# Patient Record
Sex: Female | Born: 1980 | Race: Black or African American | Hispanic: No | Marital: Single | Smoking: Current every day smoker
Health system: Southern US, Community
[De-identification: ages and names within clinical notes are randomized; demographics above are authoritative.]

## PROBLEM LIST (undated history)

## (undated) DIAGNOSIS — N200 Calculus of kidney: Secondary | ICD-10-CM

## (undated) DIAGNOSIS — Z91199 Patient's noncompliance with other medical treatment and regimen due to unspecified reason: Secondary | ICD-10-CM

## (undated) DIAGNOSIS — F1210 Cannabis abuse, uncomplicated: Secondary | ICD-10-CM

## (undated) DIAGNOSIS — R569 Unspecified convulsions: Secondary | ICD-10-CM

## (undated) DIAGNOSIS — Z9119 Patient's noncompliance with other medical treatment and regimen: Secondary | ICD-10-CM

## (undated) DIAGNOSIS — R87629 Unspecified abnormal cytological findings in specimens from vagina: Secondary | ICD-10-CM

## (undated) DIAGNOSIS — T1490XA Injury, unspecified, initial encounter: Secondary | ICD-10-CM

## (undated) DIAGNOSIS — D649 Anemia, unspecified: Secondary | ICD-10-CM

## (undated) HISTORY — PX: THERAPEUTIC ABORTION: SHX798

---

## 1999-11-23 ENCOUNTER — Emergency Department (HOSPITAL_COMMUNITY): Admission: EM | Admit: 1999-11-23 | Discharge: 1999-11-23 | Payer: Self-pay | Admitting: Emergency Medicine

## 2001-03-10 ENCOUNTER — Emergency Department (HOSPITAL_COMMUNITY): Admission: EM | Admit: 2001-03-10 | Discharge: 2001-03-10 | Payer: Self-pay | Admitting: Emergency Medicine

## 2001-04-23 ENCOUNTER — Ambulatory Visit (HOSPITAL_COMMUNITY): Admission: RE | Admit: 2001-04-23 | Discharge: 2001-04-23 | Payer: Self-pay | Admitting: *Deleted

## 2001-06-03 ENCOUNTER — Ambulatory Visit (HOSPITAL_COMMUNITY): Admission: RE | Admit: 2001-06-03 | Discharge: 2001-06-03 | Payer: Self-pay | Admitting: *Deleted

## 2001-09-05 ENCOUNTER — Inpatient Hospital Stay (HOSPITAL_COMMUNITY): Admission: AD | Admit: 2001-09-05 | Discharge: 2001-09-05 | Payer: Self-pay | Admitting: *Deleted

## 2001-09-06 ENCOUNTER — Ambulatory Visit (HOSPITAL_COMMUNITY): Admission: RE | Admit: 2001-09-06 | Discharge: 2001-09-06 | Payer: Self-pay | Admitting: *Deleted

## 2001-09-06 ENCOUNTER — Encounter: Payer: Self-pay | Admitting: *Deleted

## 2001-11-06 ENCOUNTER — Inpatient Hospital Stay (HOSPITAL_COMMUNITY): Admission: AD | Admit: 2001-11-06 | Discharge: 2001-11-08 | Payer: Self-pay | Admitting: *Deleted

## 2004-05-14 ENCOUNTER — Emergency Department (HOSPITAL_COMMUNITY): Admission: EM | Admit: 2004-05-14 | Discharge: 2004-05-14 | Payer: Self-pay | Admitting: Emergency Medicine

## 2007-08-22 ENCOUNTER — Emergency Department (HOSPITAL_COMMUNITY): Admission: EM | Admit: 2007-08-22 | Discharge: 2007-08-22 | Payer: Self-pay | Admitting: Emergency Medicine

## 2007-10-07 ENCOUNTER — Ambulatory Visit (HOSPITAL_COMMUNITY): Admission: RE | Admit: 2007-10-07 | Discharge: 2007-10-07 | Payer: Self-pay | Admitting: Obstetrics & Gynecology

## 2007-10-24 ENCOUNTER — Ambulatory Visit (HOSPITAL_COMMUNITY): Admission: RE | Admit: 2007-10-24 | Discharge: 2007-10-24 | Payer: Self-pay | Admitting: Family Medicine

## 2007-11-08 ENCOUNTER — Ambulatory Visit (HOSPITAL_COMMUNITY): Admission: RE | Admit: 2007-11-08 | Discharge: 2007-11-08 | Payer: Self-pay | Admitting: Family Medicine

## 2007-11-27 ENCOUNTER — Ambulatory Visit (HOSPITAL_COMMUNITY): Admission: RE | Admit: 2007-11-27 | Discharge: 2007-11-27 | Payer: Self-pay | Admitting: Obstetrics & Gynecology

## 2007-12-10 ENCOUNTER — Inpatient Hospital Stay (HOSPITAL_COMMUNITY): Admission: AD | Admit: 2007-12-10 | Discharge: 2007-12-10 | Payer: Self-pay | Admitting: Obstetrics and Gynecology

## 2007-12-10 ENCOUNTER — Ambulatory Visit: Payer: Self-pay | Admitting: Obstetrics and Gynecology

## 2007-12-26 ENCOUNTER — Ambulatory Visit (HOSPITAL_COMMUNITY): Admission: RE | Admit: 2007-12-26 | Discharge: 2007-12-26 | Payer: Self-pay | Admitting: Obstetrics & Gynecology

## 2008-04-27 ENCOUNTER — Inpatient Hospital Stay (HOSPITAL_COMMUNITY): Admission: AD | Admit: 2008-04-27 | Discharge: 2008-04-29 | Payer: Self-pay | Admitting: Obstetrics & Gynecology

## 2008-04-27 ENCOUNTER — Ambulatory Visit: Payer: Self-pay | Admitting: Obstetrics & Gynecology

## 2008-04-27 ENCOUNTER — Ambulatory Visit: Payer: Self-pay | Admitting: Family

## 2008-05-28 ENCOUNTER — Emergency Department (HOSPITAL_COMMUNITY): Admission: EM | Admit: 2008-05-28 | Discharge: 2008-05-28 | Payer: Self-pay | Admitting: Emergency Medicine

## 2008-08-29 IMAGING — US US OB FOLLOW-UP
1 series · 14 of 28 positions shown · non-contrast
Comparison: none

OBSTETRICAL ULTRASOUND:
 This ultrasound exam was performed in the [HOSPITAL] Ultrasound Department.  The OB US report was generated in the AS system, and faxed to the ordering physician.  This report is also available in [REDACTED] PACS.

[Series 1: us ob follow-up · 0.28mm/px · 14 of 45 slices shown]
[im 2/45]
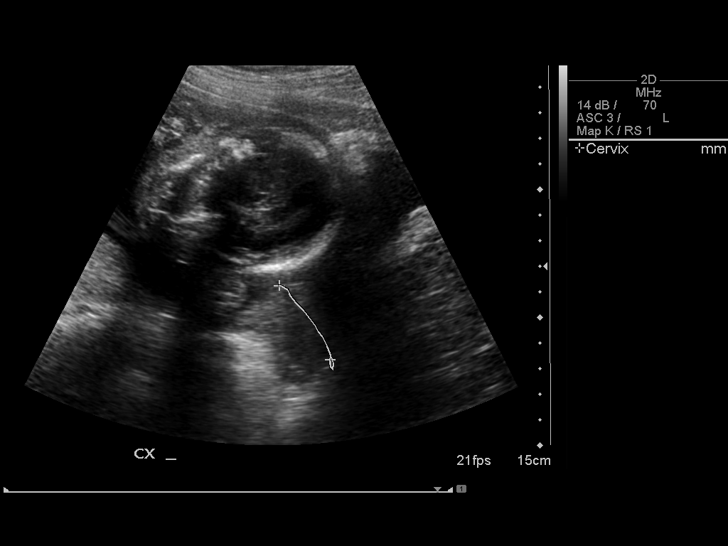
[im 5/45]
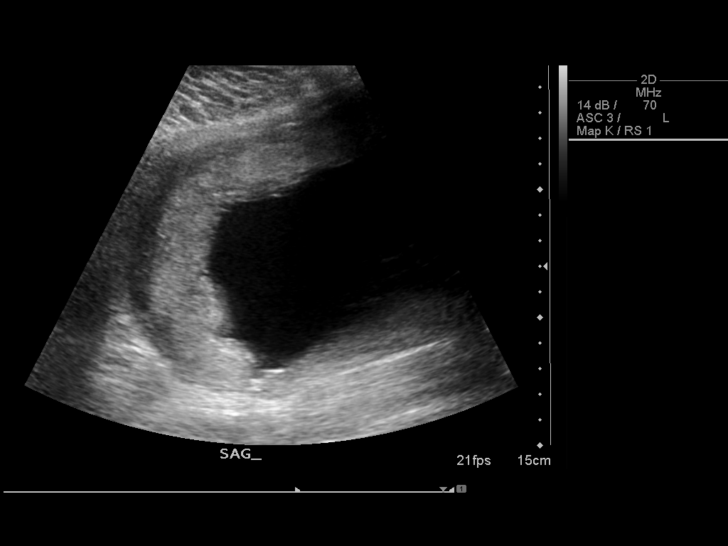
[im 9/45]
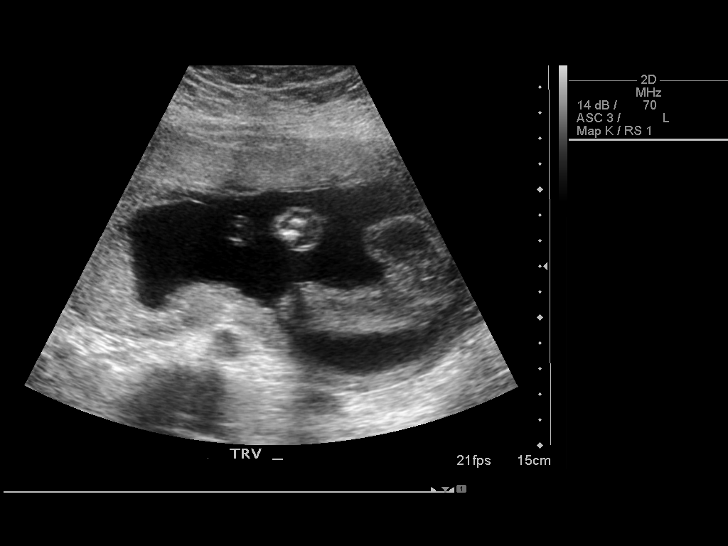
[im 12/45]
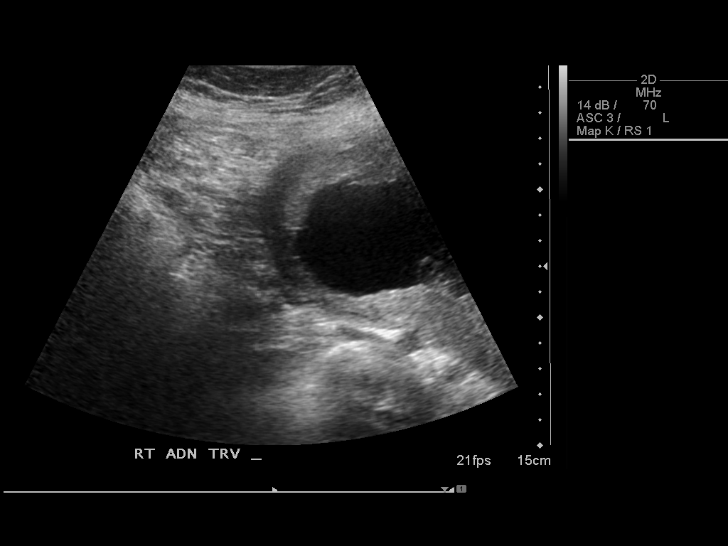
[im 15/45]
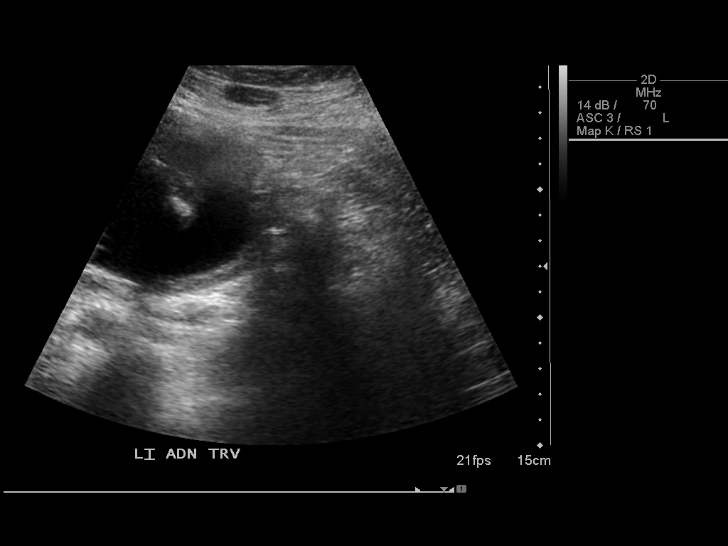
[im 18/45]
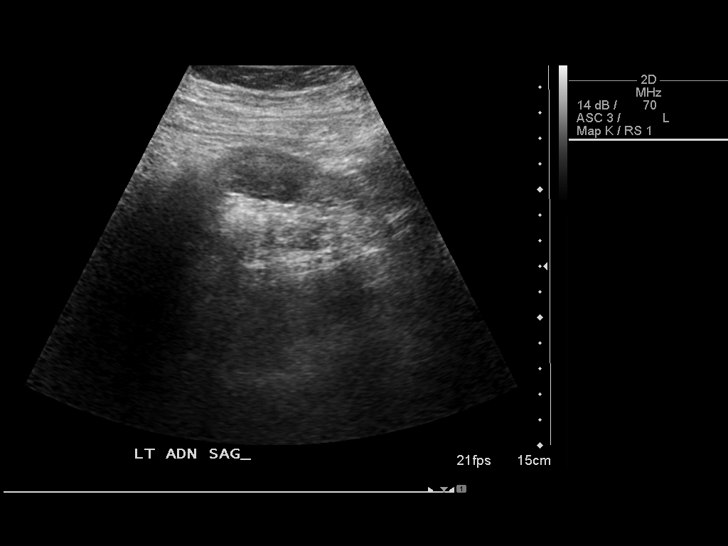
[im 22/45]
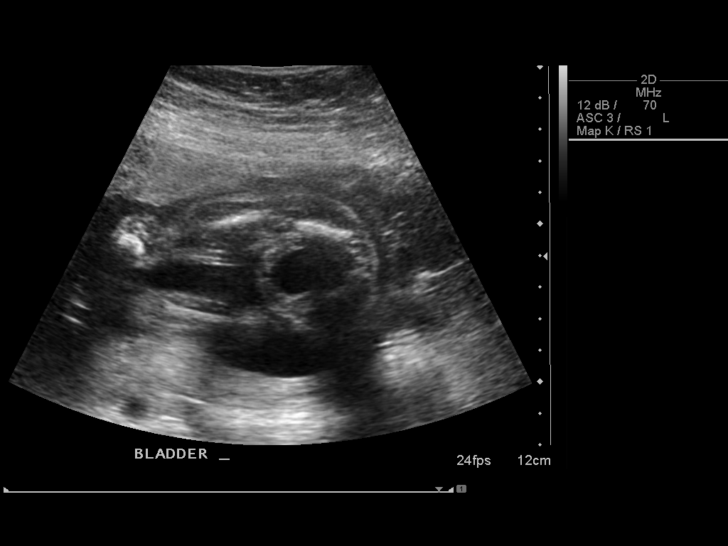
[im 25/45]
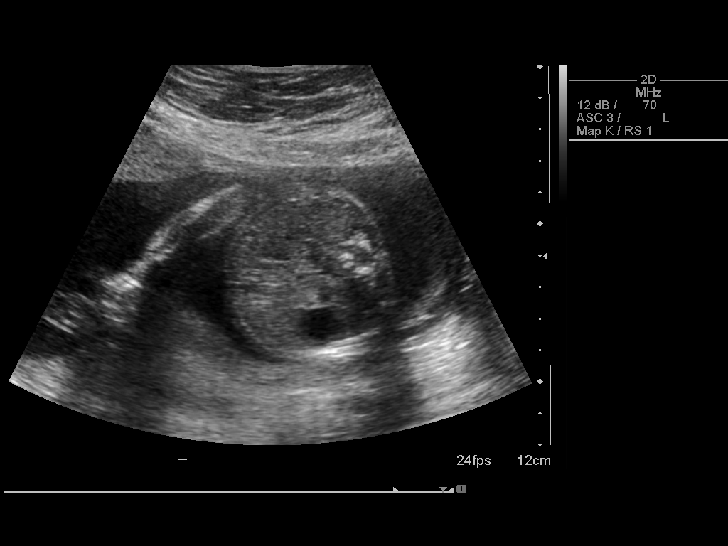
[im 28/45]
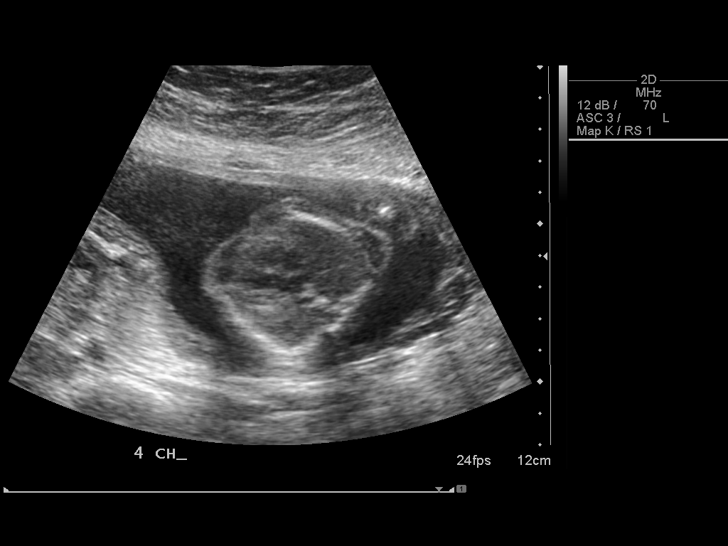
[im 31/45]
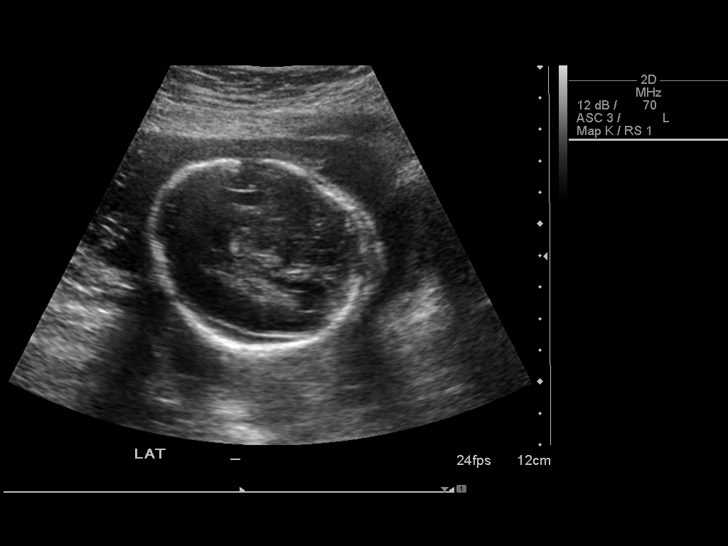
[im 35/45]
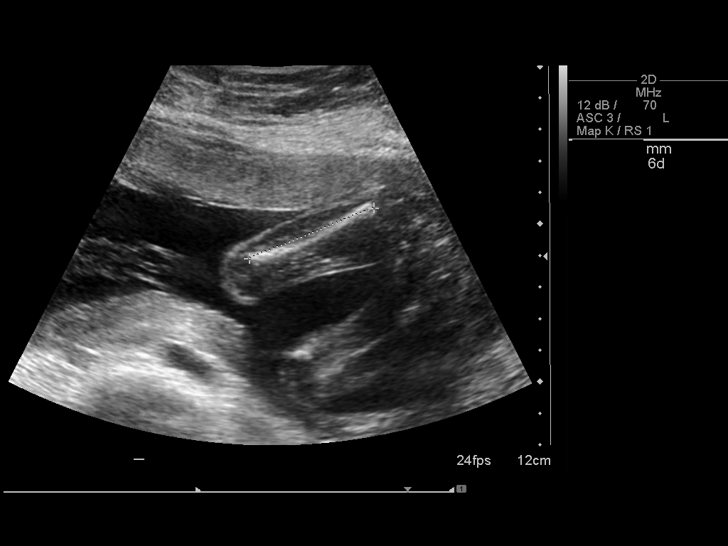
[im 38/45]
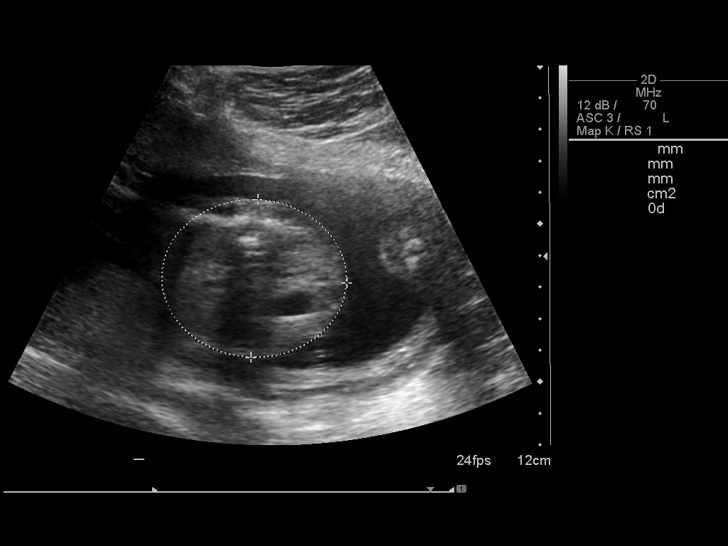
[im 41/45]
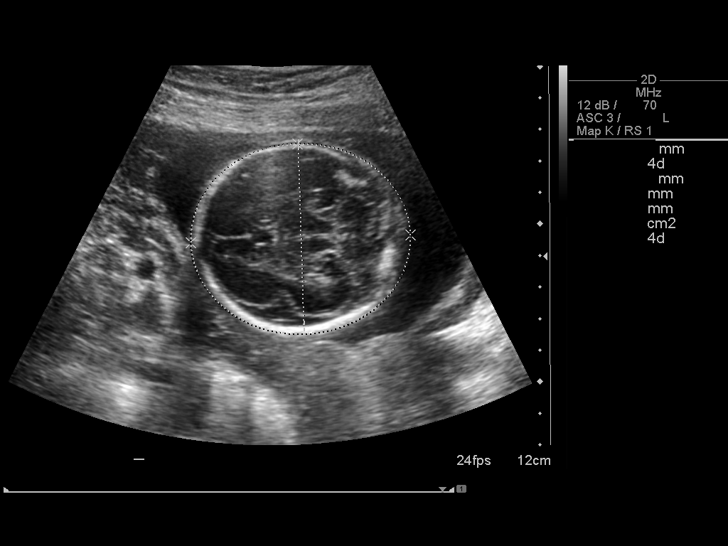
[im 45/45]
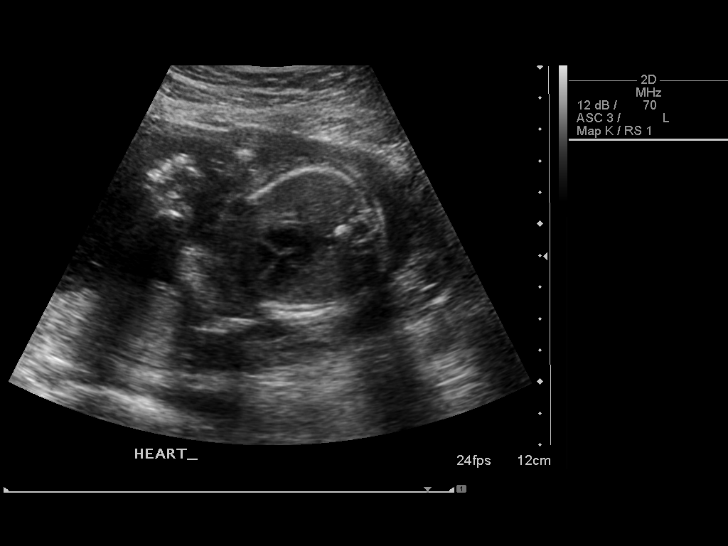

[14 of 28 positions shown; findings below may reference images not displayed]

IMPRESSION: See AS Obstetric US report.

## 2011-06-12 LAB — URINALYSIS, ROUTINE W REFLEX MICROSCOPIC
Glucose, UA: NEGATIVE
Hgb urine dipstick: NEGATIVE
Protein, ur: NEGATIVE

## 2011-06-12 LAB — URINE CULTURE

## 2011-06-12 LAB — URINE MICROSCOPIC-ADD ON

## 2011-06-16 LAB — CBC
HCT: 36.3
MCHC: 32.9
MCV: 93.8
Platelets: 179
RDW: 15.8 — ABNORMAL HIGH

## 2011-06-26 LAB — BASIC METABOLIC PANEL
Chloride: 107
Creatinine, Ser: 0.74
GFR calc Af Amer: >60
GFR calc non Af Amer: >60
Potassium: 3.8

## 2011-06-26 LAB — URINALYSIS, ROUTINE W REFLEX MICROSCOPIC
Glucose, UA: NEGATIVE
Ketones, ur: NEGATIVE
Protein, ur: NEGATIVE

## 2011-06-26 LAB — HCG, QUANTITATIVE, PREGNANCY: hCG, Beta Chain, Quant, S: 2660 — ABNORMAL HIGH

## 2011-06-26 LAB — URINE MICROSCOPIC-ADD ON

## 2011-06-26 LAB — GC/CHLAMYDIA PROBE AMP, GENITAL: GC Probe Amp, Genital: NEGATIVE

## 2011-06-26 LAB — WET PREP, GENITAL: Yeast Wet Prep HPF POC: NONE SEEN

## 2013-09-07 ENCOUNTER — Emergency Department (HOSPITAL_COMMUNITY)
Admission: EM | Admit: 2013-09-07 | Discharge: 2013-09-07 | Disposition: A | Discharge status: Home / Self Care | Payer: Self-pay | Attending: Emergency Medicine | Admitting: Emergency Medicine

## 2013-09-07 ENCOUNTER — Encounter (HOSPITAL_COMMUNITY): Payer: Self-pay | Admitting: Emergency Medicine

## 2013-09-07 DIAGNOSIS — Z8679 Personal history of other diseases of the circulatory system: Secondary | ICD-10-CM | POA: Insufficient documentation

## 2013-09-07 DIAGNOSIS — F17200 Nicotine dependence, unspecified, uncomplicated: Secondary | ICD-10-CM | POA: Insufficient documentation

## 2013-09-07 DIAGNOSIS — N898 Other specified noninflammatory disorders of vagina: Secondary | ICD-10-CM | POA: Insufficient documentation

## 2013-09-07 DIAGNOSIS — R300 Dysuria: Secondary | ICD-10-CM | POA: Insufficient documentation

## 2013-09-07 DIAGNOSIS — R42 Dizziness and giddiness: Secondary | ICD-10-CM | POA: Insufficient documentation

## 2013-09-07 DIAGNOSIS — R51 Headache: Secondary | ICD-10-CM | POA: Insufficient documentation

## 2013-09-07 DIAGNOSIS — A599 Trichomoniasis, unspecified: Secondary | ICD-10-CM

## 2013-09-07 DIAGNOSIS — Z3202 Encounter for pregnancy test, result negative: Secondary | ICD-10-CM | POA: Insufficient documentation

## 2013-09-07 LAB — URINALYSIS, ROUTINE W REFLEX MICROSCOPIC
Bilirubin Urine: NEGATIVE
Hgb urine dipstick: NEGATIVE
Ketones, ur: NEGATIVE mg/dL
Nitrite: NEGATIVE
Specific Gravity, Urine: 1.018 (ref 1.005–1.030)
pH: 5.5 (ref 5.0–8.0)

## 2013-09-07 LAB — CBC
HCT: 42.1 % (ref 36.0–46.0)
MCH: 30.5 pg (ref 26.0–34.0)
MCV: 91.7 fL (ref 78.0–100.0)
Platelets: 196 10*3/uL (ref 150–400)
RBC: 4.59 MIL/uL (ref 3.87–5.11)

## 2013-09-07 LAB — URINE MICROSCOPIC-ADD ON

## 2013-09-07 LAB — WET PREP, GENITAL: Yeast Wet Prep HPF POC: NONE SEEN

## 2013-09-07 LAB — PREGNANCY, URINE: Preg Test, Ur: NEGATIVE

## 2013-09-07 MED ORDER — METRONIDAZOLE 500 MG PO TABS
2000.0000 mg | ORAL_TABLET | Freq: Once | ORAL | Status: AC
Start: 2013-09-07 — End: 2013-09-07
  Administered 2013-09-07: 2000 mg via ORAL
  Filled 2013-09-07: qty 4

## 2013-09-07 MED ORDER — METRONIDAZOLE 500 MG PO TABS: 2000.0000 mg | ORAL_TABLET | Freq: Once | ORAL | Status: DC

## 2013-09-07 NOTE — ED Provider Notes (Signed)
CSN: [id number]     Arrival date & time 09/07/13  5621 History   None    Chief Complaint  Patient presents with  . Abdominal Pain  . Headache   (Consider location/radiation/quality/duration/timing/severity/associated sxs/prior Treatment) HPI [name] is a 32 y.o. female w/ no known PMHx, presents to the [facility] ED w/ complaints of suprapubic pain and dysuria for the past week. The patient says she works out frequently and says she thought her abdominal pain was associated with her exercises, however, this morning, the pain was worse than before and accompanied by significant dysuria. She claims the abdominal pain is sharp in nature, 8/10 in severity, slightly worse with movement, and not relieved by ibuprofen which she took earlier this AM. The dysuria she claims is new and very uncomfortable, making her want to stop her urine stream. She claims the urine is slightly pinkish in color. She also admits to some scant, clear, vaginal discharge, odorless and without vaginal discomfort or pruritis. The patient also admits to having recent unprotected sex with one sexual partner. No recent fever, chills, diarrhea, or vomiting, but does have some mild nausea, dizziness, and headaches.  LMP 08/18/13.  History reviewed. No pertinent past medical history. History reviewed. No pertinent past surgical history. Family History  Problem Relation Age of Onset  . Migraines Mother    History  Substance Use Topics  . Smoking status: Current Every Day Smoker -- 0.50 packs/day    Types: Cigarettes  . Smokeless tobacco: Not on file  . Alcohol Use: 0.6 oz/week    1 Shots of liquor per week     Comment: social    OB History   Grav Para Term Preterm Abortions TAB SAB Ect Mult Living   2              Review of Systems General: Denies fever, chills, diaphoresis, appetite change and fatigue.  Respiratory: Denies SOB, DOE, cough, chest tightness, and wheezing.   Cardiovascular: Denies chest pain,  palpitations and leg swelling.  Gastrointestinal: Positive for nausea abdominal/suprapubic pain. Denies vomiting, diarrhea, constipation, blood in stool and abdominal distention.  Genitourinary: Positive for dysuria. Denies urgency, frequency, hematuria, flank pain and difficulty urinating.  Endocrine: Denies hot or cold intolerance, sweats, polyuria, polydipsia. Musculoskeletal: Denies myalgias, back pain, joint swelling, arthralgias and gait problem.  Skin: Denies pallor, rash and wounds.  Neurological: Positive for dizziness, headaches. Denies seizures, syncope, weakness, lightheadedness, numbness.  Psychiatric/Behavioral: Denies mood changes, confusion, nervousness, sleep disturbance and agitation.  Allergies  Review of patient's allergies indicates no known allergies.  Home Medications  No current outpatient prescriptions on file. BP 129/82  Pulse 95  Temp(Src) 98.2 F (36.8 C) (Oral)  SpO2 100%  LMP 08/18/2013 Physical Exam Filed Vitals:   09/07/13 0734  BP: 129/82  Pulse: 95  Temp: 98.2 F (36.8 C)  TempSrc: Oral  SpO2: 100%  General: Vital signs reviewed.  Patient is a well-developed and well-nourished, in no acute distress and cooperative with exam. Alert and oriented x3.  Head: Normocephalic and atraumatic. Eyes: PERRL, EOMI, conjunctivae normal, No scleral icterus.  Neck: Supple, trachea midline, normal ROM, No JVD, masses, thyromegaly, or carotid bruit present.  Cardiovascular: RRR, S1 normal, S2 normal, no murmurs, gallops, or rubs. Pulmonary/Chest: Normal respiratory effort, CTAB, no wheezes, rales, or rhonchi. Abdominal: Soft, tender in the suprapubic region, non-distended, bowel sounds are normal, no masses, organomegaly, or guarding present.  GU: Moderate whitish discharge at the cervical os, no cervical motion tenderness. No  foul odor. Musculoskeletal: No joint deformities, erythema, or stiffness, ROM full and no nontender. Extremities: No swelling or edema,   pulses symmetric and intact bilaterally. No cyanosis or clubbing. Neurological: A&O x3, Strength is normal and symmetric bilaterally, cranial nerve II-XII are grossly intact, no focal motor deficit, sensory intact to light touch bilaterally.  Skin: Warm, dry and intact. No rashes or erythema. Psychiatric: Normal mood and affect. speech and behavior is normal. Cognition and memory are normal.   ED Course  Procedures (including critical care time) Labs Review Labs Reviewed  GC/CHLAMYDIA PROBE AMP  WET PREP, GENITAL  CBC  URINALYSIS, ROUTINE W REFLEX MICROSCOPIC   Imaging Review No results found.  EKG Interpretation   None      MDM   [name] is a 32 y.o. female w/ no known PMHx, presents to the [facility] ED w/ complaints of suprapubic pain, dysuria, mild dizziness, nausea, and headaches. Given clinical presentation, most likely uncomplicated cystitis at this time, however, given her recent history of unprotected sex and mild vaginal discharge, STI's also possible. Vaginal exam did not reveal cervical motion tenderness, however, moderate whitish discharge was seen at the cervical os. Suprapubic pain on pelvic exam.  -UA shows -ve nitrites, moderate leukocytes, trichomonas, few bacteria, but with many squamous cells.  -Upreg -ve -CBC wnl -Wet prep w/ GC Chlamydia probe shows trichomonas w/ clue cells.   Will treat patient w/ Flagyl 2g once. Patient otherwise stable for discharge. Will have patient follow [facility] with [facility] and will give resource guide for women's health. Will also discharge with Flagyl 2g Rx for partner as well.   Courtney Paris, MD 09/07/13 [phone]

## 2013-09-07 NOTE — ED Provider Notes (Signed)
I saw and evaluated the patient, reviewed the resident's note and I agree with the findings and plan.   .Face to face Exam:  General:  Awake HEENT:  Atraumatic Resp:  Normal effort Abd:  Nondistended Neuro:No focal weakness  Nelia Shi, MD 09/07/13 [phone]

## 2013-09-07 NOTE — ED Notes (Signed)
Patient resting at this time. Denies pain.

## 2013-09-07 NOTE — ED Notes (Signed)
Patient is from home. Patient c/o of dizziness, H/A, abdominal pain and pain on urination for one week, however this morning was the worst. Patient reports that she is also having vaginal discharge. Patient has not taken any OTC medications for symptoms. Patient is appropriate for age in NAD.

## 2013-09-08 LAB — GC/CHLAMYDIA PROBE AMP: GC Probe RNA: NEGATIVE

## 2013-09-08 LAB — URINE CULTURE

## 2013-11-21 ENCOUNTER — Emergency Department (HOSPITAL_COMMUNITY)
Admission: EM | Admit: 2013-11-21 | Discharge: 2013-11-21 | Disposition: A | Discharge status: Home / Self Care | Payer: Medicaid Other | Attending: Emergency Medicine | Admitting: Emergency Medicine

## 2013-11-21 ENCOUNTER — Emergency Department (HOSPITAL_COMMUNITY): Payer: Medicaid Other

## 2013-11-21 ENCOUNTER — Encounter (HOSPITAL_COMMUNITY): Payer: Self-pay | Admitting: Emergency Medicine

## 2013-11-21 DIAGNOSIS — O99330 Smoking (tobacco) complicating pregnancy, unspecified trimester: Secondary | ICD-10-CM | POA: Diagnosis not present

## 2013-11-21 DIAGNOSIS — R109 Unspecified abdominal pain: Secondary | ICD-10-CM | POA: Insufficient documentation

## 2013-11-21 DIAGNOSIS — Z3490 Encounter for supervision of normal pregnancy, unspecified, unspecified trimester: Secondary | ICD-10-CM

## 2013-11-21 DIAGNOSIS — O210 Mild hyperemesis gravidarum: Secondary | ICD-10-CM | POA: Insufficient documentation

## 2013-11-21 DIAGNOSIS — R319 Hematuria, unspecified: Secondary | ICD-10-CM | POA: Diagnosis not present

## 2013-11-21 DIAGNOSIS — O9989 Other specified diseases and conditions complicating pregnancy, childbirth and the puerperium: Secondary | ICD-10-CM | POA: Diagnosis not present

## 2013-11-21 DIAGNOSIS — R110 Nausea: Secondary | ICD-10-CM

## 2013-11-21 LAB — COMPREHENSIVE METABOLIC PANEL
ALT: 8 U/L (ref 0–35)
AST: 12 U/L (ref 0–37)
Albumin: 3.8 g/dL (ref 3.5–5.2)
Alkaline Phosphatase: 51 U/L (ref 39–117)
BILIRUBIN TOTAL: 0.3 mg/dL (ref 0.3–1.2)
BUN: 8 mg/dL (ref 6–23)
CALCIUM: 9 mg/dL (ref 8.4–10.5)
CO2: 20 mEq/L (ref 19–32)
CREATININE: 0.61 mg/dL (ref 0.50–1.10)
Chloride: 104 mEq/L (ref 96–112)
GFR calc non Af Amer: >90 mL/min (ref >90)
GLUCOSE: 108 mg/dL — AB (ref 70–99)
Potassium: 3.9 mEq/L (ref 3.7–5.3)
Sodium: 141 mEq/L (ref 137–147)
Total Protein: 7.1 g/dL (ref 6.0–8.3)

## 2013-11-21 LAB — CBC WITH DIFFERENTIAL/PLATELET
BASOS ABS: 0 10*3/uL (ref 0.0–0.1)
Basophils Relative: 0 % (ref 0–1)
EOS PCT: 1 % (ref 0–5)
Eosinophils Absolute: 0.1 10*3/uL (ref 0.0–0.7)
HEMATOCRIT: 38.1 % (ref 36.0–46.0)
HEMOGLOBIN: 13.3 g/dL (ref 12.0–15.0)
LYMPHS ABS: 3.1 10*3/uL (ref 0.7–4.0)
LYMPHS PCT: 28 % (ref 12–46)
MCH: 31.8 pg (ref 26.0–34.0)
MCHC: 34.9 g/dL (ref 30.0–36.0)
MCV: 91.1 fL (ref 78.0–100.0)
MONO ABS: 0.9 10*3/uL (ref 0.1–1.0)
Monocytes Relative: 8 % (ref 3–12)
Neutro Abs: 7.1 10*3/uL (ref 1.7–7.7)
Neutrophils Relative %: 63 % (ref 43–77)
Platelets: 206 10*3/uL (ref 150–400)
RBC: 4.18 MIL/uL (ref 3.87–5.11)
RDW: 13.6 % (ref 11.5–15.5)
WBC: 11.3 10*3/uL — ABNORMAL HIGH (ref 4.0–10.5)

## 2013-11-21 LAB — URINALYSIS, ROUTINE W REFLEX MICROSCOPIC
BILIRUBIN URINE: NEGATIVE
Glucose, UA: NEGATIVE mg/dL
HGB URINE DIPSTICK: NEGATIVE
Ketones, ur: 40 mg/dL — AB
Leukocytes, UA: NEGATIVE
NITRITE: NEGATIVE
PROTEIN: NEGATIVE mg/dL
SPECIFIC GRAVITY, URINE: 1.025 (ref 1.005–1.030)
Urobilinogen, UA: 0.2 mg/dL (ref 0.0–1.0)
pH: 6 (ref 5.0–8.0)

## 2013-11-21 LAB — WET PREP, GENITAL
TRICH WET PREP: NONE SEEN
Yeast Wet Prep HPF POC: NONE SEEN

## 2013-11-21 LAB — POC URINE PREG, ED: Preg Test, Ur: POSITIVE — AB

## 2013-11-21 MED ORDER — ONDANSETRON HCL 4 MG/2ML IJ SOLN
4.0000 mg | Freq: Once | INTRAMUSCULAR | Status: AC
  Administered 2013-11-21: 4 mg via INTRAVENOUS
  Filled 2013-11-21: qty 2

## 2013-11-21 MED ORDER — SODIUM CHLORIDE 0.9 % IV BOLUS (SEPSIS)
1000.0000 mL | Freq: Once | INTRAVENOUS | Status: AC
  Administered 2013-11-21: 1000 mL via INTRAVENOUS

## 2013-11-21 MED ORDER — ONDANSETRON 4 MG PO TBDP: 4.0000 mg | ORAL_TABLET | Freq: Three times a day (TID) | ORAL | Status: DC | PRN

## 2013-11-21 NOTE — ED Notes (Signed)
NP at bedside.

## 2013-11-21 NOTE — ED Provider Notes (Signed)
CSN: [id number]     Arrival date & time 11/21/13  1828 History   First MD Initiated Contact with Patient 11/21/13 2020     Chief Complaint  Patient presents with  . Emesis     (Consider location/radiation/quality/duration/timing/severity/associated sxs/prior Treatment) HPI Comments: Patient is unsure of her exact last menstrual cycle.  She did home pregnancy test yesterday, which was, positive.  She's had some nausea and vomiting for the past several, days.  Today.  She is dated that when she used the bathroom.  She noticed blood on the tissue x1.  She's also developed suprapubic abdominal cramping, pain.  Denies any vaginal discharge  Patient is a 33 y.o. female presenting with vomiting. The history is provided by the patient.  Emesis Severity:  Moderate Timing:  Intermittent Quality:  Bilious material Progression:  Unchanged Chronicity:  New Recent urination:  Normal Relieved by:  None tried Worsened by:  Nothing tried Ineffective treatments:  None tried Associated symptoms: abdominal pain   Associated symptoms: no cough, no diarrhea, no fever, no headaches, no myalgias, no sore throat and no URI   Abdominal pain:    Location:  Suprapubic   Quality:  Dull   Severity:  Mild   Onset quality:  Gradual   Duration:  1 day   Timing:  Intermittent   Progression:  Unchanged   Chronicity:  New Risk factors: pregnant now     History reviewed. No pertinent past medical history. History reviewed. No pertinent past surgical history. Family History  Problem Relation Age of Onset  . Migraines Mother    History  Substance Use Topics  . Smoking status: Current Every Day Smoker -- 0.50 packs/day    Types: Cigarettes  . Smokeless tobacco: Not on file  . Alcohol Use: 0.6 oz/week    1 Shots of liquor per week     Comment: social    OB History   Grav Para Term Preterm Abortions TAB SAB Ect Mult Living   2              Review of Systems  Constitutional: Negative for fever.  HENT:  Negative for sore throat.   Gastrointestinal: Positive for nausea, vomiting and abdominal pain. Negative for diarrhea.  Genitourinary: Positive for hematuria. Negative for dysuria, frequency and vaginal discharge.  Musculoskeletal: Negative for myalgias.  Neurological: Negative for headaches.  All other systems reviewed and are negative.      Allergies  Review of patient's allergies indicates no known allergies.  Home Medications  No current outpatient prescriptions on file. BP 105/59  Pulse 64  Temp(Src) 98.7 F (37.1 C) (Oral)  Resp 18  Ht 5\' 7"  (1.702 m)  Wt 179 lb (81.194 kg)  BMI 28.03 kg/m2  SpO2 100% Physical Exam  Nursing note and vitals reviewed. Constitutional: She appears well-developed and well-nourished.  Eyes: Pupils are equal, round, and reactive to light.  Neck: Normal range of motion.  Cardiovascular: Normal rate.   Pulmonary/Chest: Effort normal and breath sounds normal.  Abdominal: Soft. She exhibits no distension. There is tenderness in the suprapubic area.  Genitourinary: Uterus is not enlarged and not tender. Cervix exhibits discharge. Cervix exhibits no friability. No tenderness or bleeding around the vagina. No vaginal discharge found.  Patient has had his hand on in the vaginal vault since January 28 she thinks when removed.  There is a small amount of discharge from the cervical OS.  This has been, cultured, as well as one tiny spot of bleeding.  Cervical os appears closed  Musculoskeletal: Normal range of motion.  Neurological: She is alert.  Skin: Skin is warm and dry. No pallor.    ED Course  Procedures (including critical care time) Labs Review Labs Reviewed  CBC WITH DIFFERENTIAL - Abnormal; Notable for the following:    WBC 11.3 (*)    All other components within normal limits  COMPREHENSIVE METABOLIC PANEL - Abnormal; Notable for the following:    Glucose, Bld 108 (*)    All other components within normal limits  URINALYSIS, ROUTINE W  REFLEX MICROSCOPIC - Abnormal; Notable for the following:    APPearance HAZY (*)    Ketones, ur 40 (*)    All other components within normal limits  POC URINE PREG, ED - Abnormal; Notable for the following:    Preg Test, Ur POSITIVE (*)    All other components within normal limits  GC/CHLAMYDIA PROBE AMP  WET PREP, GENITAL  HCG, QUANTITATIVE, PREGNANCY  RPR  HIV ANTIBODY (ROUTINE TESTING)   Imaging Review No results found.   EKG Interpretation None      MDM  Patient has a 7 week 4 day intrauterine pregnancy.  [facility] Final diagnoses:  None         Arman FilterGail K Miamarie Moll, NP 11/21/13 2256

## 2013-11-21 NOTE — Discharge Instructions (Signed)
7 weeks, and 4, days pregnant with an intrauterine pregnancy.  [facility]

## 2013-11-21 NOTE — ED Notes (Signed)
US paged pt is ready.

## 2013-11-21 NOTE — ED Notes (Addendum)
Pt reports she had a positive pregnancy test at home last week, unsure of how far along she is. Today she had a TB test then immediately after she began to have nausea, vomiting and not feeling well. Reports abd pain as well. Her last period was in january

## 2013-11-22 LAB — RPR: RPR: NONREACTIVE

## 2013-11-22 LAB — HIV ANTIBODY (ROUTINE TESTING W REFLEX): HIV: NONREACTIVE

## 2013-11-22 NOTE — ED Provider Notes (Signed)
Medical screening examination/treatment/procedure(s) were performed by non-physician practitioner and as supervising physician I was immediately available for consultation/collaboration.   EKG Interpretation None       Doug SouSam Rhiana Morash, MD 11/22/13 0000

## 2013-11-23 LAB — GC/CHLAMYDIA PROBE AMP
CT Probe RNA: NEGATIVE
GC Probe RNA: NEGATIVE

## 2014-06-10 ENCOUNTER — Emergency Department (HOSPITAL_COMMUNITY)
Admission: EM | Admit: 2014-06-10 | Discharge: 2014-06-10 | Disposition: A | Discharge status: Home / Self Care | Payer: Medicaid Other | Attending: Emergency Medicine | Admitting: Emergency Medicine

## 2014-06-10 ENCOUNTER — Encounter (HOSPITAL_COMMUNITY): Payer: Self-pay | Admitting: Emergency Medicine

## 2014-06-10 ENCOUNTER — Emergency Department (HOSPITAL_COMMUNITY): Payer: Medicaid Other

## 2014-06-10 DIAGNOSIS — M545 Low back pain, unspecified: Secondary | ICD-10-CM | POA: Insufficient documentation

## 2014-06-10 DIAGNOSIS — M549 Dorsalgia, unspecified: Secondary | ICD-10-CM

## 2014-06-10 DIAGNOSIS — F17200 Nicotine dependence, unspecified, uncomplicated: Secondary | ICD-10-CM | POA: Insufficient documentation

## 2014-06-10 MED ORDER — HYDROCODONE-ACETAMINOPHEN 5-325 MG PO TABS: 1.0000 | ORAL_TABLET | ORAL | Status: DC | PRN

## 2014-06-10 MED ORDER — METHOCARBAMOL 500 MG PO TABS
500.0000 mg | ORAL_TABLET | Freq: Two times a day (BID) | ORAL | Status: DC
Start: 2014-06-10 — End: 2015-05-08

## 2014-06-10 MED ORDER — OXYCODONE-ACETAMINOPHEN 5-325 MG PO TABS
1.0000 | ORAL_TABLET | Freq: Once | ORAL | Status: AC
  Administered 2014-06-10: 1 via ORAL
  Filled 2014-06-10: qty 1

## 2014-06-10 NOTE — ED Notes (Signed)
Coughing this am and was bending over to spit and felt a pop in her back and now it hurts to move

## 2014-06-10 NOTE — Discharge Planning (Signed)
[facility] Community Liaison  Spoke to patient about primary care resources and establishing care with a provider. Orange card application and instructions provided. Resource guide and my contact information provided for any future questions or concerns. No other community liaison needs identified at this time.

## 2014-06-10 NOTE — ED Provider Notes (Signed)
Medical screening examination/treatment/procedure(s) were performed by non-physician practitioner and as supervising physician I was immediately available for consultation/collaboration.   EKG Interpretation None        Akire Rennert, DO 06/10/14 1816 

## 2014-06-10 NOTE — Discharge Instructions (Signed)
Take the prescribed medication as directed.  May wish to apply warm compresses to low back to help with pain. Follow-up with [facility]. Return to the ED for new or worsening symptoms.

## 2014-06-10 NOTE — ED Provider Notes (Signed)
CSN: [id number]     Arrival date & time 06/10/14  1006 History  This chart was scribed for non-physician practitioner, Sharilyn Sites, PA-C working with Samuel Jester, DO by Greggory Stallion, ED scribe. This patient was seen in room TR10C/TR10C and the patient's care was started at 12:13 PM.   Chief Complaint  Patient presents with  . Back Pain   The history is provided by the patient. No language interpreter was used.   HPI Comments: Sabrina Simpson is a 33 y.o. female who presents to the Emergency Department complaining of sudden onset lower back pain that started this morning. Movement worsens the pain and causes it to radiate into her legs. Reports associated tingling. Denies numbness or weakness.  States she was coughing, bent over to spit out the sputum and felt and pop in her back. Denies history of similar pain or back surgeries. No loss of bowel or bladder control.  No intervention tried PTA.  Patient tearful on arrival.  History reviewed. No pertinent past medical history. History reviewed. No pertinent past surgical history. Family History  Problem Relation Age of Onset  . Migraines Mother    History  Substance Use Topics  . Smoking status: Current Every Day Smoker -- 0.50 packs/day    Types: Cigarettes  . Smokeless tobacco: Not on file  . Alcohol Use: 0.6 oz/week    1 Shots of liquor per week     Comment: social    OB History   Grav Para Term Preterm Abortions TAB SAB Ect Mult Living   2              Review of Systems  Musculoskeletal: Positive for back pain and myalgias.  Neurological: Negative for numbness.  All other systems reviewed and are negative.  Allergies  Review of patient's allergies indicates no known allergies.  Home Medications   Prior to Admission medications   Medication Sig Start Date End Date Taking? Authorizing Provider  ondansetron (ZOFRAN ODT) 4 MG disintegrating tablet Take 1 tablet (4 mg total) by mouth every 8 (eight) hours as needed for  nausea or vomiting. 11/21/13   Arman Filter, NP   BP 108/60  Pulse 75  Temp(Src) 98.8 F (37.1 C) (Oral)  Resp 18  SpO2 100%  Physical Exam  Nursing note and vitals reviewed. Constitutional: She is oriented to person, place, and time. She appears well-developed and well-nourished.  HENT:  Head: Normocephalic and atraumatic.  Mouth/Throat: Oropharynx is clear and moist.  Eyes: Conjunctivae and EOM are normal. Pupils are equal, round, and reactive to light.  Neck: Normal range of motion.  Cardiovascular: Normal rate, regular rhythm and normal heart sounds.   Pulmonary/Chest: Effort normal and breath sounds normal.  Abdominal: Soft. Bowel sounds are normal.  Musculoskeletal: Normal range of motion.  Midline lumbar spine exquisitely tender to palpation. No gross deformities noted. Limited ROM due to pain. Normal strength and sensation of bilateral lower extremities. Unable to ambulate due to pain.   Neurological: She is alert and oriented to person, place, and time.  Skin: Skin is warm and dry.  Psychiatric: She has a normal mood and affect.    ED Course  Procedures (including critical care time)  DIAGNOSTIC STUDIES: Oxygen Saturation is 100% on RA, normal by my interpretation.    COORDINATION OF CARE: 12:15 PM-Discussed treatment plan which includes xray and pain medication with pt at bedside and pt agreed to plan.   Labs Review Labs Reviewed - No data to display  Imaging Review Dg Lumbar Spine Complete  06/10/2014   CLINICAL DATA:  Severe low back pain  EXAM: LUMBAR SPINE - COMPLETE 4+ VIEW  COMPARISON:  None.  FINDINGS: Frontal, lateral, spot lumbosacral lateral, and bilateral oblique views were obtained. There are 5 non-rib-bearing lumbar type vertebral bodies. There is no fracture or spondylolisthesis. There is no appreciable disc space narrowing. There is no appreciable facet arthropathy.  IMPRESSION: No fracture or spondylolisthesis. No appreciable arthropathic change.    Electronically Signed   By: Bretta Bang M.D.   On: 06/10/2014 13:20     EKG Interpretation None      MDM   Final diagnoses:  Back pain, unspecified location   Back pain after coughing and bending over to spit this morning.  Patient tearful on arrival and during exam.  Limited ROM of LS and exquisite tenderness noted to even light palpation.  No focal deficits on exam.  BLE remain neurologically intact.  Imaging negative for acute vertebral fx or subluxation.  Patient given percocet in the ED with some improvement of sx, was able to ambulate to restroom in ED with assistance.  Will d/c home with vicodin and robaxin.  Encouraged FU [facility]. Discussed plan with patient, he/she acknowledged understanding and agreed with plan of care.  Return precautions given for new or worsening symptoms.  I personally performed the services described in this documentation, which was scribed in my presence. The recorded information has been reviewed and is accurate.  Garlon Hatchet, PA-C 06/10/14 1434

## 2014-06-15 ENCOUNTER — Encounter (HOSPITAL_BASED_OUTPATIENT_CLINIC_OR_DEPARTMENT_OTHER): Payer: Self-pay | Admitting: Emergency Medicine

## 2014-07-05 ENCOUNTER — Encounter (HOSPITAL_COMMUNITY): Payer: Self-pay | Admitting: Emergency Medicine

## 2014-07-05 ENCOUNTER — Emergency Department (HOSPITAL_COMMUNITY)
Admission: EM | Admit: 2014-07-05 | Discharge: 2014-07-05 | Disposition: A | Discharge status: Home / Self Care | Payer: Medicaid Other | Attending: Emergency Medicine | Admitting: Emergency Medicine

## 2014-07-05 DIAGNOSIS — K029 Dental caries, unspecified: Secondary | ICD-10-CM | POA: Diagnosis not present

## 2014-07-05 DIAGNOSIS — K1379 Other lesions of oral mucosa: Secondary | ICD-10-CM | POA: Diagnosis not present

## 2014-07-05 DIAGNOSIS — Z79899 Other long term (current) drug therapy: Secondary | ICD-10-CM | POA: Insufficient documentation

## 2014-07-05 DIAGNOSIS — Z720 Tobacco use: Secondary | ICD-10-CM | POA: Diagnosis not present

## 2014-07-05 DIAGNOSIS — K088 Other specified disorders of teeth and supporting structures: Secondary | ICD-10-CM | POA: Diagnosis present

## 2014-07-05 DIAGNOSIS — K006 Disturbances in tooth eruption: Secondary | ICD-10-CM | POA: Diagnosis not present

## 2014-07-05 DIAGNOSIS — Z8679 Personal history of other diseases of the circulatory system: Secondary | ICD-10-CM | POA: Insufficient documentation

## 2014-07-05 DIAGNOSIS — R110 Nausea: Secondary | ICD-10-CM | POA: Diagnosis not present

## 2014-07-05 MED ORDER — CLINDAMYCIN HCL 150 MG PO CAPS: 150.0000 mg | ORAL_CAPSULE | Freq: Four times a day (QID) | ORAL | Status: DC

## 2014-07-05 MED ORDER — OXYCODONE-ACETAMINOPHEN 5-325 MG PO TABS: 2.0000 | ORAL_TABLET | Freq: Four times a day (QID) | ORAL | Status: DC | PRN

## 2014-07-05 MED ORDER — OXYCODONE-ACETAMINOPHEN 5-325 MG PO TABS: 1.0000 | ORAL_TABLET | Freq: Once | ORAL | Status: DC

## 2014-07-05 NOTE — ED Provider Notes (Signed)
CSN: [id number]     Arrival date & time 07/05/14  2248 History   First MD Initiated Contact with Patient 07/05/14 2323     Chief Complaint  Patient presents with  . Dental Pain     (Consider location/radiation/quality/duration/timing/severity/associated sxs/prior Treatment) HPI Pt is a 33yo female presenting to ED with c/o dental pain, gradually worsening over last 2 days in right upper molar. Pt states pain has been gradual x2 years with parts of her tooth "breaking off."  States it usually aches but last night it has been constant throbbing pain today.  Pain is 10/10 at worst.  States she has taken dollar store brand pain medication w/o relief. Denies difficulty breathing or swallowing.   History reviewed. No pertinent past medical history. History reviewed. No pertinent past surgical history. Family History  Problem Relation Age of Onset  . Migraines Mother    History  Substance Use Topics  . Smoking status: Current Every Day Smoker -- 0.50 packs/day    Types: Cigarettes  . Smokeless tobacco: Not on file  . Alcohol Use: 0.6 oz/week    1 Shots of liquor per week     Comment: social    OB History   Grav Para Term Preterm Abortions TAB SAB Ect Mult Living   2              Review of Systems  Constitutional: Negative for fever and chills.  HENT: Positive for dental problem. Negative for drooling, sore throat, trouble swallowing and voice change.   Gastrointestinal: Positive for nausea. Negative for vomiting.  All other systems reviewed and are negative.     Allergies  Review of patient's allergies indicates no known allergies.  Home Medications   Prior to Admission medications   Medication Sig Start Date End Date Taking? Authorizing Provider  clindamycin (CLEOCIN) 150 MG capsule Take 1 capsule (150 mg total) by mouth every 6 (six) hours. 07/05/14   Junius FinnerErin O'Malley, PA-C  HYDROcodone-acetaminophen (NORCO/VICODIN) 5-325 MG per tablet Take 1 tablet by mouth every 4 (four)  hours as needed. 06/10/14   Garlon HatchetLisa M Sanders, PA-C  methocarbamol (ROBAXIN) 500 MG tablet Take 1 tablet (500 mg total) by mouth 2 (two) times daily. 06/10/14   Garlon HatchetLisa M Sanders, PA-C  ondansetron (ZOFRAN ODT) 4 MG disintegrating tablet Take 1 tablet (4 mg total) by mouth every 8 (eight) hours as needed for nausea or vomiting. 11/21/13   Arman FilterGail K Schulz, NP  oxyCODONE-acetaminophen (PERCOCET/ROXICET) 5-325 MG per tablet Take 2 tablets by mouth every 6 (six) hours as needed for moderate pain or severe pain. 07/05/14   Junius FinnerErin O'Malley, PA-C   BP 110/67  Pulse 68  Temp(Src) 98 F (36.7 C) (Oral)  Resp 16  Ht 5\' 6"  (1.676 m)  Wt 175 lb (79.379 kg)  BMI 28.26 kg/m2  SpO2 100%  LMP 05/12/2014 Physical Exam  Nursing note and vitals reviewed. Constitutional: She is oriented to person, place, and time. She appears well-developed and well-nourished.  HENT:  Head: Normocephalic and atraumatic.  Nose: Nose normal.  Mouth/Throat: Uvula is midline, oropharynx is clear and moist and mucous membranes are normal. Oral lesions ( white gray flim on buccal mucosa) present. No trismus in the jaw. Abnormal dentition. Dental caries present. No dental abscesses.    No gingival abscess.  Eyes: EOM are normal.  Neck: Normal range of motion.  Cardiovascular: Normal rate.   Pulmonary/Chest: Effort normal.  Musculoskeletal: Normal range of motion.  Neurological: She is alert and oriented to  person, place, and time.  Skin: Skin is warm and dry.  Psychiatric: She has a normal mood and affect. Her behavior is normal.    ED Course  Procedures (including critical care time) Labs Review Labs Reviewed - No data to display  Imaging Review No results found.   EKG Interpretation None      MDM   Final diagnoses:  Pain due to dental caries   Pt presenting to ED with c/o dental pain due to dental caries. No evidence of gingival abscess. Will tx with clindamycin and percocet. Advised to f/u with Dr. Mayford Knifeurner, DDS, and  provided community resource guide for further evaluation and treatment of continued dental pain. Return precautions provided. Pt verbalized understanding and agreement with tx plan.     Junius FinnerErin O'Malley, PA-C 07/05/14 2348

## 2014-07-05 NOTE — ED Notes (Signed)
Pt presents with Right upper molar pain x2 days, t states it has been gradually "breaking off" x2 years. Usually aches at night but has been a constant throbbing pain today.

## 2014-07-05 NOTE — Discharge Instructions (Signed)
Emergency Department Resource Guide °1) Find a Doctor and Pay Out of Pocket °Although you won't have to find out who is covered by your insurance plan, it is a good idea to ask around and get recommendations. You will then need to call the office and see if the doctor you have chosen will accept you as a new patient and what types of options they offer for patients who are self-pay. Some doctors offer discounts or will set up payment plans for their patients who do not have insurance, but you will need to ask so you aren't surprised when you get to your appointment. ° °2) Contact Your Local Health Department °Not all health departments have doctors that can see patients for sick visits, but many do, so it is worth a call to see if yours does. If you don't know where your local health department is, you can check in your phone book. The CDC also has a tool to help you locate your state's health department, and many state websites also have listings of all of their local health departments. ° °3) Find a Walk-in Clinic °If your illness is not likely to be very severe or complicated, you may want to try a walk in clinic. These are popping up all over the country in pharmacies, drugstores, and shopping centers. They're usually staffed by nurse practitioners or physician assistants that have been trained to treat common illnesses and complaints. They're usually fairly quick and inexpensive. However, if you have serious medical issues or chronic medical problems, these are probably not your best option. ° °No Primary Care Doctor: °- Call [facility] at  832-8000 - they can help you locate a primary care doctor that  accepts your insurance, provides certain services, etc. °- Physician Referral Service- 1-800-533-3463 ° °Chronic Pain Problems: °Organization         Address  Phone   Notes  °[facility]  ([phone]) 297-2271 Patients need to be referred by their primary care doctor.  ° °Medication  Assistance: °Organization         Address  Phone   Notes  °[place] [facility] [address] °[place], [place] [address] (336) 641-8030 --Must be a resident of [place] °-- Must have NO insurance coverage whatsoever (no Medicaid/ Medicare, etc.) °-- The pt. MUST have a primary care doctor that directs their care regularly and follows them in the community °  °[facility]  (866) 331-1348   °[employer]  (888) 892-1162   ° °Agencies that provide inexpensive medical care: °Organization         Address                                                       Phone                                                                            Notes  °[facility]  (336) 832-8035   °[facility]    (336)   832-7272   °[facility] [address] °[place], [place] [address] (336) 832-4777   °[facility] [address], °[place] (336) 271-4999   °[facility]    (336) 373-0678   °[facility]    (336) 272-1050   °[facility] ° [address], [place] Phone:  (336) 832-4444, Fax:  (336) 832-4440 Hours of Operation:  9 am - 6 pm, M-F.  Also accepts Medicaid/Medicare and self-pay.  °[facility] ° [address], [place] Phone: (336) [phone], Fax: (336) [phone]. Hours of Operation:  8:30 am - 5:30 pm, M-F.  Also accepts Medicaid and self-pay.  °[facility] [place] [address], [place] Phone: (336) 878-6027   °[facility] [address], [place], [place] (336)723-1848, Ext. 123 Mondays & Thursdays: 7-9 AM.  First 15 patients are seen on a first come, first serve basis. °  ° °Medicaid-accepting [place] Providers: ° °Organization         Address                                                                       Phone                               Notes  °[facility] [address], [place] (336) [phone] Also accepts self-pay patients.  °[facility] [address], [place] ° (336) [phone]   °[facility] [address], [place] (336) 288-8857   °[facility] [address] [place] [address], [place] (336) 299-7000   °[facility] [address], [place]  ° (336) 373-1557 Only accepts [place] Access Medicaid patients after they have their name applied to their card.  ° °Self-Pay (no insurance) in [place]: °  °Organization         Address                                                     Phone               Notes  °Sickle Cell Patients, [facility] [address], [place] (336) 832-1970   °[facility] [address], [place] (336) 832-4400   °[facility] [place] ° [address], [place] (336) 992-4800   °[facility]/Dr. Osei-Bonsu ° [address] [place] [address], [place] or [address], [place] (336) [phone] Phone number for both [place] and [place] locations is the same.  °[facility] [address], [place] (336) [phone]   °[facility] [address] [place] [address], [place] or [address] (336) 852-7530 °(336) 878-2260   °[facility] [address],  (336) 350-1642, phone; (336) 294-5005, fax Sees patients 1st and 3rd Saturday of   every month.  Must not qualify for public or private insurance (i.e. Medicaid, Medicare, [place] Health Choice, Veterans' Benefits) • Household income should be no more than 200% of the poverty level •The clinic cannot treat you if you are pregnant or think you are pregnant • Sexually transmitted diseases are not treated at the clinic.  ° °_____________Dental Care:______________ °Organization         Address                                  Phone                       Notes  °[place]  [facility] [address], [place] (336) 641-6152 Accepts children up to age 21 who are enrolled in Medicaid or [place] Health Choice; pregnant women with a Medicaid card; and children who have applied for Medicaid or [place] Health Choice, but were declined, whose parents can pay a reduced fee at time of service.  °[facility] [place]  [address], [place] (336) 641-7733 Accepts children up to age 21 who are enrolled in Medicaid or [place] Health Choice; pregnant women with a Medicaid card; and children who have applied for Medicaid or [place] Health Choice, but were declined, whose parents can pay a reduced fee at time of service.  °[facility] ° [address], [place] (336) 641-4533 Patients are seen by appointment only. Walk-ins are not accepted. [facility] will see patients 18 years of age and older. °Monday - Tuesday (8am-5pm) °Most Wednesdays (8:30-5pm) °$30 per visit, cash only  °[facility] ° [address], [place] (336) 641-4533 Patients are seen by appointment only. Walk-ins are not accepted. [facility] will see patients 18 years of age and older. °One Wednesday Evening (Monthly: Volunteer Based).  $30 per visit, cash only  °[facility]  (919) 537-3737 for adults; Children under age 4, call Graduate Pediatric Dentistry at (919) [phone]. Children aged 4-14, please call (919) [phone] to request a pediatric application. ° Dental services are provided in all areas of dental care including fillings, crowns and bridges, complete and partial dentures, implants, gum treatment, root canals, and extractions. Preventive care is also provided. Treatment is provided to both adults and children. °Patients are selected via a lottery and there is often a waiting list. °  °[facility] [address], °[place] ° (336) 763-8833  [email or web address] °  °[facility] [address], [place], [place] (336)723-1848, Ext. 123 Second and Fourth Thursday of each month, opens at 6:30 AM; Clinic ends at 9 AM.  Patients are seen on a first-come first-served basis, and a limited number are seen during each clinic.  ° °[facility] ° [address], [place], [place] (336) [phone]   Eligibility Requirements °You must have lived in [place], [place], or [place] counties for at least the last three months. °  You cannot be eligible for state or federal sponsored healthcare insurance, including Veterans Administration, Medicaid, or Medicare. °  You generally cannot be eligible for healthcare insurance through your employer.  °  How to apply: °Eligibility screenings are held every Tuesday and Wednesday afternoon from 1:00 pm until 4:00 pm. You do not need an appointment for the interview!  °  [facility] [address], [place], [place] [phone]   °[facility]  [phone]   °[facility]  [phone]   °[facility]  [phone]   ° °

## 2014-07-06 NOTE — ED Provider Notes (Signed)
Medical screening examination/treatment/procedure(s) were performed by non-physician practitioner and as supervising physician I was immediately available for consultation/collaboration.   Dione Boozeavid Gaylynn Seiple, MD 07/06/14 [phone]

## 2014-07-20 ENCOUNTER — Encounter (HOSPITAL_COMMUNITY): Payer: Self-pay | Admitting: Emergency Medicine

## 2014-10-03 ENCOUNTER — Emergency Department (HOSPITAL_COMMUNITY): Payer: Medicaid Other

## 2014-10-03 ENCOUNTER — Encounter (HOSPITAL_COMMUNITY): Payer: Self-pay | Admitting: Emergency Medicine

## 2014-10-03 ENCOUNTER — Emergency Department (HOSPITAL_COMMUNITY)
Admission: EM | Admit: 2014-10-03 | Discharge: 2014-10-03 | Disposition: A | Discharge status: Home / Self Care | Payer: Medicaid Other | Attending: Emergency Medicine | Admitting: Emergency Medicine

## 2014-10-03 DIAGNOSIS — R569 Unspecified convulsions: Secondary | ICD-10-CM | POA: Insufficient documentation

## 2014-10-03 DIAGNOSIS — Z720 Tobacco use: Secondary | ICD-10-CM | POA: Insufficient documentation

## 2014-10-03 DIAGNOSIS — R292 Abnormal reflex: Secondary | ICD-10-CM | POA: Insufficient documentation

## 2014-10-03 DIAGNOSIS — R51 Headache: Secondary | ICD-10-CM | POA: Insufficient documentation

## 2014-10-03 DIAGNOSIS — Z79899 Other long term (current) drug therapy: Secondary | ICD-10-CM | POA: Insufficient documentation

## 2014-10-03 DIAGNOSIS — Z3202 Encounter for pregnancy test, result negative: Secondary | ICD-10-CM | POA: Insufficient documentation

## 2014-10-03 DIAGNOSIS — R110 Nausea: Secondary | ICD-10-CM | POA: Insufficient documentation

## 2014-10-03 DIAGNOSIS — R252 Cramp and spasm: Secondary | ICD-10-CM

## 2014-10-03 LAB — BASIC METABOLIC PANEL
Anion gap: 12 (ref 5–15)
BUN: 6 mg/dL (ref 6–23)
CALCIUM: 8.8 mg/dL (ref 8.4–10.5)
CO2: 20 mmol/L (ref 19–32)
Chloride: 109 mEq/L (ref 96–112)
Creatinine, Ser: 0.66 mg/dL (ref 0.50–1.10)
GFR calc Af Amer: >90 mL/min (ref >90)
GLUCOSE: 87 mg/dL (ref 70–99)
Potassium: 3.7 mmol/L (ref 3.5–5.1)
SODIUM: 141 mmol/L (ref 135–145)

## 2014-10-03 LAB — CBC
HEMATOCRIT: 39.7 % (ref 36.0–46.0)
HEMOGLOBIN: 13 g/dL (ref 12.0–15.0)
MCH: 30 pg (ref 26.0–34.0)
MCHC: 32.7 g/dL (ref 30.0–36.0)
MCV: 91.5 fL (ref 78.0–100.0)
PLATELETS: 241 10*3/uL (ref 150–400)
RBC: 4.34 MIL/uL (ref 3.87–5.11)
RDW: 13.8 % (ref 11.5–15.5)
WBC: 8.1 10*3/uL (ref 4.0–10.5)

## 2014-10-03 LAB — RAPID URINE DRUG SCREEN, HOSP PERFORMED
Amphetamines: NOT DETECTED
BENZODIAZEPINES: NOT DETECTED
Barbiturates: NOT DETECTED
Cocaine: NOT DETECTED
OPIATES: NOT DETECTED
Tetrahydrocannabinol: POSITIVE — AB

## 2014-10-03 LAB — CBG MONITORING, ED: Glucose-Capillary: 88 mg/dL (ref 70–99)

## 2014-10-03 LAB — URINALYSIS, ROUTINE W REFLEX MICROSCOPIC
BILIRUBIN URINE: NEGATIVE
Glucose, UA: NEGATIVE mg/dL
HGB URINE DIPSTICK: NEGATIVE
Ketones, ur: NEGATIVE mg/dL
NITRITE: NEGATIVE
Protein, ur: NEGATIVE mg/dL
Specific Gravity, Urine: 1.022 (ref 1.005–1.030)
UROBILINOGEN UA: 1 mg/dL (ref 0.0–1.0)
pH: 7.5 (ref 5.0–8.0)

## 2014-10-03 LAB — URINE MICROSCOPIC-ADD ON

## 2014-10-03 LAB — PREGNANCY, URINE: PREG TEST UR: NEGATIVE

## 2014-10-03 MED ORDER — CEPHALEXIN 250 MG PO CAPS
500.0000 mg | ORAL_CAPSULE | Freq: Once | ORAL | Status: AC
  Administered 2014-10-03: 500 mg via ORAL
  Filled 2014-10-03: qty 2

## 2014-10-03 MED ORDER — SODIUM CHLORIDE 0.9 % IV SOLN
Freq: Once | INTRAVENOUS | Status: AC
  Administered 2014-10-03: 10:00:00 via INTRAVENOUS

## 2014-10-03 MED ORDER — ONDANSETRON HCL 4 MG/2ML IJ SOLN
4.0000 mg | Freq: Once | INTRAMUSCULAR | Status: DC
  Filled 2014-10-03: qty 2

## 2014-10-03 MED ORDER — ONDANSETRON HCL 4 MG/2ML IJ SOLN
4.0000 mg | Freq: Once | INTRAMUSCULAR | Status: AC
  Administered 2014-10-03: 4 mg via INTRAVENOUS

## 2014-10-03 MED ORDER — LORAZEPAM 1 MG PO TABS
1.0000 mg | ORAL_TABLET | Freq: Once | ORAL | Status: AC
  Administered 2014-10-03: 1 mg via ORAL
  Filled 2014-10-03: qty 1

## 2014-10-03 MED ORDER — LORAZEPAM 2 MG/ML IJ SOLN: 1.0000 mg | Freq: Once | INTRAMUSCULAR | Status: DC | PRN

## 2014-10-03 MED ORDER — GADOBENATE DIMEGLUMINE 529 MG/ML IV SOLN
17.0000 mL | Freq: Once | INTRAVENOUS | Status: AC | PRN
  Administered 2014-10-03: 17 mL via INTRAVENOUS

## 2014-10-03 MED ORDER — LORAZEPAM 2 MG/ML IJ SOLN
1.0000 mg | Freq: Once | INTRAMUSCULAR | Status: AC
  Administered 2014-10-03: 1 mg via INTRAVENOUS
  Filled 2014-10-03: qty 1

## 2014-10-03 NOTE — ED Notes (Signed)
PT TO DESK WANTING TO LEAVE

## 2014-10-03 NOTE — ED Notes (Signed)
Pt assisted out of vehicle for ?Seizure. Pt does not have history of seizures. Upon arrival to car pt in front passenger side having seizure like activity. Upon opening the car door, pt responsive and able  Pt assisted out of car into wheelchair. Pt had another episode of seizure like activity lasting few seconds. Pt AAOx3 after each episode. Pt taken to [facility], where she had another episode.

## 2014-10-03 NOTE — ED Notes (Signed)
Pt admits to drinking tequila "all night." ED PA notified.

## 2014-10-03 NOTE — ED Provider Notes (Signed)
CSN: [id number]     Arrival date & time 10/03/14  0914 History   First MD Initiated Contact with Patient 10/03/14 [phone]     Chief Complaint  Patient presents with  . Seizures     (Consider location/radiation/quality/duration/timing/severity/associated sxs/prior Treatment) Patient is a 34 y.o. female presenting with seizures. The history is provided by the patient. No language interpreter was used.  Seizures Seizure activity on arrival: no   Seizure type:  Unable to specify Preceding symptoms: headache and nausea   Initial focality:  Unable to specify Episode characteristics: abnormal movements   Return to baseline: yes   Number of seizures this episode:  3 Progression:  Resolved Context: alcohol withdrawal   Recent head injury:  No recent head injuries History of seizures: no   Pt is reported to have drank tequila shots until she passed out last pm.  No food.  Pt's boyfriend reports jerking.  He thinks pt had a seizure  History reviewed. No pertinent past medical history. History reviewed. No pertinent past surgical history. Family History  Problem Relation Age of Onset  . Migraines Mother    History  Substance Use Topics  . Smoking status: Current Every Day Smoker -- 0.50 packs/day    Types: Cigarettes  . Smokeless tobacco: Not on file  . Alcohol Use: 0.6 oz/week    1 Shots of liquor per week     Comment: social    OB History    Gravida Para Term Preterm AB TAB SAB Ectopic Multiple Living   2              Review of Systems  Neurological: Positive for seizures.  All other systems reviewed and are negative.     Allergies  Review of patient's allergies indicates no known allergies.  Home Medications   Prior to Admission medications   Medication Sig Start Date End Date Taking? Authorizing Provider  clindamycin (CLEOCIN) 150 MG capsule Take 1 capsule (150 mg total) by mouth every 6 (six) hours. 07/05/14   Junius FinnerErin O'Malley, PA-C  HYDROcodone-acetaminophen  (NORCO/VICODIN) 5-325 MG per tablet Take 1 tablet by mouth every 4 (four) hours as needed. 06/10/14   Garlon HatchetLisa M Sanders, PA-C  methocarbamol (ROBAXIN) 500 MG tablet Take 1 tablet (500 mg total) by mouth 2 (two) times daily. 06/10/14   Garlon HatchetLisa M Sanders, PA-C  ondansetron (ZOFRAN ODT) 4 MG disintegrating tablet Take 1 tablet (4 mg total) by mouth every 8 (eight) hours as needed for nausea or vomiting. 11/21/13   Arman FilterGail K Schulz, NP  oxyCODONE-acetaminophen (PERCOCET/ROXICET) 5-325 MG per tablet Take 2 tablets by mouth every 6 (six) hours as needed for moderate pain or severe pain. 07/05/14   Junius FinnerErin O'Malley, PA-C   BP 135/68 mmHg  Temp(Src) 99.1 F (37.3 C) (Oral)  Resp 16  Ht 5\' 6"  (1.676 m)  Wt 171 lb (77.565 kg)  BMI 27.61 kg/m2  SpO2 100%  LMP 09/11/2014 (Approximate) Physical Exam  Constitutional: She is oriented to person, place, and time. She appears well-developed and well-nourished.  HENT:  Head: Normocephalic and atraumatic.  Right Ear: External ear normal.  Left Ear: External ear normal.  Nose: Nose normal.  Mouth/Throat: Oropharynx is clear and moist.  Eyes: Conjunctivae and EOM are normal. Pupils are equal, round, and reactive to light.  Neck: Normal range of motion.  Cardiovascular: Normal rate and regular rhythm.   Pulmonary/Chest: Effort normal and breath sounds normal.  Abdominal: Soft. She exhibits no distension.  Musculoskeletal: Normal range of motion.  Neurological: She is alert and oriented to person, place, and time.  Skin: Skin is warm.  Psychiatric: She has a normal mood and affect.  Nursing note and vitals reviewed.   ED Course  Procedures (including critical care time) Labs Review Labs Reviewed  BASIC METABOLIC PANEL  CBC  CBG MONITORING, ED    Imaging Review No results found.   EKG Interpretation   Date/Time:  Saturday October 03 2014 09:21:55 EST Ventricular Rate:  69 PR Interval:  182 QRS Duration: 89 QT Interval:  391 QTC Calculation: 419 R Axis:    97 Text Interpretation:  Sinus rhythm Borderline right axis deviation  Confirmed by Lincoln Brigham [phone]) on 10/03/2014 9:28:26 AM      MDM   Final diagnoses:  Jerking movements of extremities    Pt given Iv fluids, ativan and zofran.  I advised pt she may have had an alcohol withdrawal seizure.  Pt is awake and alert. No neuro deficit.  Pt counseled on alcoholism.  I advised outpt treatment    Elson Areas, PA-C 10/03/14 1154  Tilden Fossa, MD 10/03/14 1430

## 2014-10-03 NOTE — ED Notes (Signed)
CBG 88 

## 2014-10-03 NOTE — ED Notes (Signed)
Patient transported to MRI 

## 2014-10-03 NOTE — ED Notes (Signed)
Dr Madilyn Hookrees went in to discuss the mri called results with pt. She will have a neuro consult

## 2014-10-03 NOTE — ED Provider Notes (Signed)
I spoke to Dr. Roseanne RenoStewart with neurology. He recommends outpatient follow-up. He she does not need acute antiepileptic therapy today. Patient has nothing acute in her brain, all chronic findings, can follow with neurology as an outpatient. Counseled on restriction of activities since she has had a seizure. Stable for discharge.  1. Jerking movements of extremities   2. Seizure      Elwin MochaBlair Paraskevi Funez, MD 10/03/14 [phone]

## 2014-10-03 NOTE — ED Provider Notes (Signed)
Patient signed out to me by WinnettSofia, Cordelia PochePA-C.  Patient with reported seizure.  She has also been drinking very heavily.  Plan: Follow-up on head CT.  If negative, DC to home with outpatient resources.  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  This would be unusual in this age group, and radiology recommends w/wo contrast MRI.    Discussed with Dr. Madilyn Hookees, who agrees with the plan.  Patient signed out to Dr. Gwendolyn GrantWalden at shift change.  Plan:  Follow up on MRI.  DC if negative.  Treat UTI.  Roxy Horsemanobert Orvill Coulthard, PA-C 10/03/14 1513  Tilden FossaElizabeth Rees, MD 10/03/14 [phone]

## 2014-10-03 NOTE — Discharge Instructions (Signed)
Seizure, Adult °A seizure is abnormal electrical activity in the brain. Seizures usually last from 30 seconds to 2 minutes. There are various types of seizures. °Before a seizure, you may have a warning sensation (aura) that a seizure is about to occur. An aura may include the following symptoms:  °· Fear or anxiety. °· Nausea. °· Feeling like the room is spinning (vertigo). °· Vision changes, such as seeing flashing lights or spots. °Common symptoms during a seizure include: °· A change in attention or behavior (altered mental status). °· Convulsions with rhythmic jerking movements. °· Drooling. °· Rapid eye movements. °· Grunting. °· Loss of bladder and bowel control. °· Bitter taste in the mouth. °· Tongue biting. °After a seizure, you may feel confused and sleepy. You may also have an injury resulting from convulsions during the seizure. °HOME CARE INSTRUCTIONS  °· If you are given medicines, take them exactly as prescribed by your health care provider. °· Keep all follow-up appointments as directed by your health care provider. °· Do not swim or drive or engage in risky activity during which a seizure could cause further injury to you or others until your health care provider says it is OK. °· Get adequate rest. °· Teach friends and family what to do if you have a seizure. They should: °¨ Lay you on the ground to prevent a fall. °¨ Put a cushion under your head. °¨ Loosen any tight clothing around your neck. °¨ Turn you on your side. If vomiting occurs, this helps keep your airway clear. °¨ Stay with you until you recover. °¨ Know whether or not you need emergency care. °SEEK IMMEDIATE MEDICAL CARE IF: °· The seizure lasts longer than 5 minutes. °· The seizure is severe or you do not wake up immediately after the seizure. °· You have an altered mental status after the seizure. °· You are having more frequent or worsening seizures. °Someone should drive you to the emergency department or call local emergency  services (911 in [place] °MAKE SURE YOU: °· Understand these instructions. °· Will watch your condition. °· Will get help right away if you are not doing well or get worse. °Document Released: 09/01/2000 Document Revised: 06/25/2013 Document Reviewed: 04/16/2013 °ExitCare® Patient Information ©2015 [employer]. This information is not intended to replace advice given to you by your health care provider. Make sure you discuss any questions you have with your health care provider. ° °Emergency Department Resource Guide °1) Find a Doctor and Pay Out of Pocket °Although you won't have to find out who is covered by your insurance plan, it is a good idea to ask around and get recommendations. You will then need to call the office and see if the doctor you have chosen will accept you as a new patient and what types of options they offer for patients who are self-pay. Some doctors offer discounts or will set up payment plans for their patients who do not have insurance, but you will need to ask so you aren't surprised when you get to your appointment. ° °2) Contact Your Local Health Department °Not all health departments have doctors that can see patients for sick visits, but many do, so it is worth a call to see if yours does. If you don't know where your local health department is, you can check in your phone book. The CDC also has a tool to help you locate your state's health department, and many state websites also have listings of all of their   local health departments. ° °3) Find a Walk-in Clinic °If your illness is not likely to be very severe or complicated, you may want to try a walk in clinic. These are popping up all over the country in pharmacies, drugstores, and shopping centers. They're usually staffed by nurse practitioners or physician assistants that have been trained to treat common illnesses and complaints. They're usually fairly quick and inexpensive. However, if you have serious medical issues or  chronic medical problems, these are probably not your best option. ° °No Primary Care Doctor: °- Call [facility] at  832-8000 - they can help you locate a primary care doctor that  accepts your insurance, provides certain services, etc. °- Physician Referral Service- 1-800-533-3463 ° °Chronic Pain Problems: °Organization         Address  Phone   Notes  °[facility]  (336) 297-2271 Patients need to be referred by their primary care doctor.  ° °Medication Assistance: °Organization         Address  Phone   Notes  °[place] [facility] [address] °[place], [place] [address] (336) 641-8030 --Must be a resident of [place] °-- Must have NO insurance coverage whatsoever (no Medicaid/ Medicare, etc.) °-- The pt. MUST have a primary care doctor that directs their care regularly and follows them in the community °  °[facility]  (866) 331-1348   °[employer]  (888) 892-1162   ° °Agencies that provide inexpensive medical care: °Organization         Address  Phone   Notes  °[facility]  (336) 832-8035   °[facility]    (336) 832-7272   °[facility] [address] °[place], [place] [address] (336) 832-4777   °[facility] [address], °[place] (336) 271-4999   °[facility]    (336) 373-0678   °[facility]    (336) 272-1050   °[facility] ° [address], [place] Phone:  (336) 832-4444, Fax:  (336) 832-4440 Hours of Operation:  9 am - 6 pm, M-F.  Also accepts Medicaid/Medicare and self-pay.  °[facility] ° [address], [place] Phone: (336) 832-3150, Fax: (336) 832-3151. Hours of Operation:  8:30 am - 5:30 pm, M-F.  Also accepts Medicaid and self-pay.  °[facility] [place] [address], [place] Phone: (336) 878-6027   °[facility] [address], [place], [place]  (336)723-1848, Ext. 123 Mondays & Thursdays: 7-9 AM.  First 15 patients are seen on a first come, first serve basis. °  ° °Medicaid-accepting [place] Providers: ° °Organization         Address  Phone   Notes  °[facility] [address], [place] (336) 641-2100 Also accepts self-pay patients.  °[facility] [address], [place] ° (336) 856-9996   °[facility] [address], [place] (336) 288-8857   °[facility] [address] [place] [address],  (336) 299-7000   °Veita Bland [address],   ° (336) 373-1557 Only accepts [place] Access Medicaid patients after they have their name applied to their card.  ° °Self-Pay (no insurance) in [place]: ° °Organization         Address  Phone   Notes  °Sickle Cell Patients,   [facility] [address], [place] (336) 832-1970   °[facility] [address], [place] (336) 832-4400   °[facility] ° [address], [place] (336) 992-4800   °[facility]/Dr. Osei-Bonsu ° [address] [place] [address], [place] or [address], [place] (336) 841-8500 Phone number for both [place] and [place] locations is the same.  °[facility] [address], [place] (336) 299-0000   °[facility] [address] [place] [address], [place] or [address] (336) 852-7530 °(336) 878-2260   °[facility] [address], [place] (336) 350-1642, phone; (336) 294-5005, fax Sees patients 1st and 3rd Saturday of every month.  Must not qualify for public or private insurance (i.e. Medicaid, Medicare, [place] Health Choice, Veterans' Benefits) • Household income should be no more than 200% of the poverty level •The clinic cannot treat you if you are pregnant or think you are pregnant • Sexually transmitted  diseases are not treated at the clinic.  ° ° °Dental Care: °Organization         Address  Phone  Notes  °[facility] [address],  (336) 641-6152 Accepts children up to age 21 who are enrolled in Medicaid or [place] Health Choice; pregnant women with a Medicaid card; and children who have applied for Medicaid or [place] Health Choice, but were declined, whose parents can pay a reduced fee at time of service.  °[facility] [place]  [address], [place] (336) 641-7733 Accepts children up to age 21 who are enrolled in Medicaid or [place] Health Choice; pregnant women with a Medicaid card; and children who have applied for Medicaid or [place] Health Choice, but were declined, whose parents can pay a reduced fee at time of service.  °[facility] ° [address],  (336) 641-4533 Patients are seen by appointment only. Walk-ins are not accepted. [facility] will see patients 18 years of age and older. °Monday - Tuesday (8am-5pm) °Most Wednesdays (8:30-5pm) °$30 per visit, cash only  °[facility] ° [address], [place] (336) 641-4533 Patients are seen by appointment only. Walk-ins are not accepted. [facility] will see patients 18 years of age and older. °One Wednesday Evening (Monthly: Volunteer Based).  $30 per visit, cash only  °[facility]  (919) 537-3737 for adults; Children under age 4, call Graduate Pediatric Dentistry at (919) [phone]. Children aged 4-14, please call (919) [phone] to request a pediatric application. ° Dental services are provided in all areas of dental care including fillings, crowns and bridges, complete and partial dentures, implants, gum treatment, root canals, and extractions. Preventive care is also provided. Treatment is provided to both adults and children. °Patients are selected via a  lottery and there is often a waiting list. °  °[facility] [address], ° ° (336) 763-8833 [email or web address] °  °[facility] [address], [place], [place] (336)723-1848, Ext. 123 Second and Fourth Thursday of each month, opens at 6:30 AM; Clinic ends at 9 AM.  Patients are seen on a first-come first-served basis, and a limited number are seen during each clinic.  ° °[facility] ° [address], [place], [place] (336) 723-7904   Eligibility Requirements °You must   have lived in [place], [place], or [place] counties for at least the last three months. °  You cannot be eligible for state or federal sponsored healthcare insurance, including Veterans Administration, Medicaid, or Medicare. °  You generally cannot be eligible for healthcare insurance through your employer.  °  How to apply: °Eligibility screenings are held every Tuesday and Wednesday afternoon from 1:00 pm until 4:00 pm. You do not need an appointment for the interview!  °[facility] [address], [place], [place] 336-631-2330   °[facility]  336-342-8273   °[facility]  336-703-3100   °[facility]  336-570-6415   ° °Behavioral Health Resources in the Community: °Intensive Outpatient Programs °Organization         Address  Phone  Notes  °[facility] [address], [place], [place] 336-878-6098   °[facility] [address], [place], [place] 336-832-9800   °[facility] [address], [place], [place] ° 336-882-2125   °[facility] [address],  °[place], [place] 1-800-853-5163 or 336-641-4981   °Substance Abuse Resources °Organization         Address  Phone  Notes  °[facility]  336-882-2125   °[facility]  336-784-9470   °[facility]  336-285-9073   °[facility]  336-845-3988   °Residential &  Outpatient Substance Abuse Program  1-800-659-3381   °Psychological Services °Organization         Address  Phone  Notes  °[facility]  336- 832-9600   °[facility]  336- 378-7881   °[facility] [address], [place] 1-800-853-5163 or 336-641-4981   ° °Mobile Crisis Teams °Organization         Address  Phone  Notes  °[facility], Mobile Crisis Care Unit  1-877-626-1772   °Assertive °[facility] ° [address] [place], [place] 336-834-9664   °Sharon DeEsch [address] °[place] 336-554-5454   ° °Self-Help/Support Groups °Organization         Address  Phone             Notes  °[facility] [place] - variety of support groups  336- 373-1402 Call for more information  °Narcotics Anonymous (NA), [facility] [address], °[place]  2 meetings at this location  ° °Residential Treatment Programs °Organization         Address  Phone  Notes  °[facility] [address],    °[place]  1-866-801-8205   °[facility] ° [address], [place], [place] 704-293-8524   °[facility] [address], [place] 336-845-3988 Admissions: 8am-3pm M-F  °[facility] [address]    °[place], [place] 336-841-1104   °[facility] [address], [place], [place] 336-379-7146   °[facility] [address]  °[place], [place] 336-285-9073   °[facility] Intensive Outpatient [address], [place], [place] 336-852-3033   °[facility] [address]  °[place], [place] 1-877-615-2722 or 336-784-9470   °[facility] [address] [place], [place] 336-227-7417 Accepts Medicaid  °[facility] [address]  °[place] 1-800-659-3381 Substance Abuse/Addiction Treatment  ° °[place] Behavioral Health Resources °Organization          Address    Phone  Notes  °[facility]  (888) 581-9988   °Julie Brannon, PhD [address] [place], [place]   (336) 349-5553 or (336) 951-0000   °[facility]   [address] °[place], [place] (336) 349-4454   °[facility] [address], [place], [place] (336) 342-8316 Insurance/Medicaid/sponsorship through [facility]  °[facility] [address]                                    [place], [place] (336) 342-8316 Therapy/tele-psych/case  °[facility] [address]  ° [place], [place] (336) 349-2233    °Dr. Arfeen  (336) 349-4544   °[facility] [place]  [employer] [place] [facility] 1) [address],  °2) [address], [place] °3)  [address], [place] (336) 349-3220 °(336) 342-7768 ° °(336) 342-8140   °[place] [facility] (336) 342-1394 or (336) 342-3537 (After Hours)    ° ° °

## 2014-10-03 NOTE — ED Notes (Signed)
Pt given warm blankets.
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
## 2014-10-03 NOTE — ED Notes (Signed)
Pt shaking and jerking upon arrival to exam room, pt is talking, answering questions appropriately, boyfriend at bedside states she fell at home and struck head on hard wood floor. No injuries noted. No bowel/bladder incontinence. Pt is alert, states 7/10 headache at present. Seizure precautions initiated.

## 2014-10-03 NOTE — ED Notes (Signed)
UP TO RESTROOM. GAIT STEADY

## 2014-10-03 NOTE — ED Notes (Signed)
SPOKE TO DR REES AND DR Gwendolyn GrantWALDEN ABOUT WHAT THE PT BOYFRIEND HAS EXPRESSED. BOTH MDS HAVE ASKED THAT SHE JUST WAIT A FEW MORE MINUTES TO GIVE NEURO A CHANCE TO CALL BACK.

## 2014-10-03 NOTE — ED Notes (Signed)
Spoke with CT scan. Pt to be transported for CT of head.

## 2014-10-03 NOTE — ED Notes (Signed)
PT MOVED TO POD C FOR FURTHER OBSERVATION. SHE IS AWAITING A MRI, MRI ADVISES WILL BE ABOUT 30-45 MINUTES

## 2014-10-03 NOTE — ED Notes (Signed)
DR Gwendolyn GrantWALDEN HAS SPOKE TO PT AND BOYFRIEND ABOUT NERUO CONSULT

## 2015-05-08 ENCOUNTER — Inpatient Hospital Stay (HOSPITAL_COMMUNITY)
Admission: AD | Admit: 2015-05-08 | Discharge: 2015-05-08 | Disposition: A | Discharge status: Home / Self Care | Payer: Medicaid Other | Source: Ambulatory Visit | Attending: Family Medicine | Admitting: Family Medicine

## 2015-05-08 ENCOUNTER — Encounter (HOSPITAL_COMMUNITY): Payer: Self-pay | Admitting: *Deleted

## 2015-05-08 DIAGNOSIS — F17210 Nicotine dependence, cigarettes, uncomplicated: Secondary | ICD-10-CM | POA: Insufficient documentation

## 2015-05-08 DIAGNOSIS — Z87442 Personal history of urinary calculi: Secondary | ICD-10-CM | POA: Diagnosis not present

## 2015-05-08 DIAGNOSIS — N946 Dysmenorrhea, unspecified: Secondary | ICD-10-CM | POA: Insufficient documentation

## 2015-05-08 DIAGNOSIS — N939 Abnormal uterine and vaginal bleeding, unspecified: Secondary | ICD-10-CM | POA: Diagnosis present

## 2015-05-08 HISTORY — DX: Unspecified abnormal cytological findings in specimens from vagina: R87.629

## 2015-05-08 HISTORY — DX: Calculus of kidney: N20.0

## 2015-05-08 HISTORY — DX: Anemia, unspecified: D64.9

## 2015-05-08 HISTORY — DX: Unspecified convulsions: R56.9

## 2015-05-08 LAB — URINALYSIS, ROUTINE W REFLEX MICROSCOPIC
BILIRUBIN URINE: NEGATIVE
Glucose, UA: NEGATIVE mg/dL
Ketones, ur: NEGATIVE mg/dL
Nitrite: NEGATIVE
PROTEIN: NEGATIVE mg/dL
Specific Gravity, Urine: 1.025 (ref 1.005–1.030)
UROBILINOGEN UA: 0.2 mg/dL (ref 0.0–1.0)
pH: 6 (ref 5.0–8.0)

## 2015-05-08 LAB — POCT PREGNANCY, URINE: PREG TEST UR: NEGATIVE

## 2015-05-08 LAB — WET PREP, GENITAL
Trich, Wet Prep: NONE SEEN
Yeast Wet Prep HPF POC: NONE SEEN

## 2015-05-08 LAB — HCG, QUANTITATIVE, PREGNANCY: hCG, Beta Chain, Quant, S: <1 m[IU]/mL (ref <5)

## 2015-05-08 LAB — URINE MICROSCOPIC-ADD ON

## 2015-05-08 MED ORDER — KETOROLAC TROMETHAMINE 60 MG/2ML IM SOLN
60.0000 mg | Freq: Once | INTRAMUSCULAR | Status: AC
  Administered 2015-05-08: 60 mg via INTRAMUSCULAR
  Filled 2015-05-08: qty 2

## 2015-05-08 NOTE — MAU Note (Signed)
Patient states she had a +HPT and started having vaginal bleeding and left flank pain 2 days ago.  She states she passed a golf ball sized clot at home.  LMP was 7/29.

## 2015-05-08 NOTE — MAU Note (Signed)
lmp end of last month; couple wks later was having breast tenderness, was feeling real heavy, was eating a lot.  Did a home test, was faintly positive.   Started spotting 2 days ago, next day got heavier.  Started having pain last night, bled all over.
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
## 2015-05-08 NOTE — Discharge Instructions (Signed)
You are not pregnant and you have not had a miscarriage. Make an appointment for GYN exam and evaluation for more effective contraceptive method. Take over the counter ibuprofen by the package directions as needed for cramping.

## 2015-05-10 LAB — GC/CHLAMYDIA PROBE AMP (~~LOC~~) NOT AT ARMC
CHLAMYDIA, DNA PROBE: NEGATIVE
Neisseria Gonorrhea: NEGATIVE

## 2015-10-26 ENCOUNTER — Inpatient Hospital Stay (HOSPITAL_COMMUNITY): Payer: Self-pay

## 2015-10-26 ENCOUNTER — Observation Stay (HOSPITAL_COMMUNITY)
Admission: AD | Admit: 2015-10-26 | Discharge: 2015-10-27 | Disposition: A | Discharge status: Home / Self Care | Payer: Self-pay | Source: Ambulatory Visit | Attending: Internal Medicine | Admitting: Internal Medicine

## 2015-10-26 ENCOUNTER — Inpatient Hospital Stay (HOSPITAL_COMMUNITY): Payer: Medicaid Other

## 2015-10-26 ENCOUNTER — Encounter (HOSPITAL_COMMUNITY): Payer: Self-pay | Admitting: *Deleted

## 2015-10-26 DIAGNOSIS — Z23 Encounter for immunization: Secondary | ICD-10-CM | POA: Insufficient documentation

## 2015-10-26 DIAGNOSIS — F17210 Nicotine dependence, cigarettes, uncomplicated: Secondary | ICD-10-CM | POA: Insufficient documentation

## 2015-10-26 DIAGNOSIS — T7411XS Adult physical abuse, confirmed, sequela: Secondary | ICD-10-CM

## 2015-10-26 DIAGNOSIS — G40909 Epilepsy, unspecified, not intractable, without status epilepticus: Principal | ICD-10-CM | POA: Insufficient documentation

## 2015-10-26 DIAGNOSIS — F1910 Other psychoactive substance abuse, uncomplicated: Secondary | ICD-10-CM | POA: Insufficient documentation

## 2015-10-26 DIAGNOSIS — T7411XA Adult physical abuse, confirmed, initial encounter: Secondary | ICD-10-CM | POA: Insufficient documentation

## 2015-10-26 DIAGNOSIS — R569 Unspecified convulsions: Secondary | ICD-10-CM

## 2015-10-26 HISTORY — DX: Injury, unspecified, initial encounter: T14.90XA

## 2015-10-26 LAB — ETHANOL

## 2015-10-26 LAB — CBC
HCT: 37.2 % (ref 36.0–46.0)
Hemoglobin: 12.2 g/dL (ref 12.0–15.0)
MCH: 29.8 pg (ref 26.0–34.0)
MCHC: 32.8 g/dL (ref 30.0–36.0)
MCV: 90.7 fL (ref 78.0–100.0)
Platelets: 239 10*3/uL (ref 150–400)
RBC: 4.1 MIL/uL (ref 3.87–5.11)
RDW: 16.3 % — ABNORMAL HIGH (ref 11.5–15.5)
WBC: 5.4 10*3/uL (ref 4.0–10.5)

## 2015-10-26 LAB — COMPREHENSIVE METABOLIC PANEL
ALT: 12 U/L — ABNORMAL LOW (ref 14–54)
AST: 18 U/L (ref 15–41)
Albumin: 4 g/dL (ref 3.5–5.0)
Alkaline Phosphatase: 54 U/L (ref 38–126)
Anion gap: 5 (ref 5–15)
BUN: 13 mg/dL (ref 6–20)
CO2: 23 mmol/L (ref 22–32)
Calcium: 8.8 mg/dL — ABNORMAL LOW (ref 8.9–10.3)
Chloride: 108 mmol/L (ref 101–111)
Creatinine, Ser: 0.8 mg/dL (ref 0.44–1.00)
GFR calc Af Amer: >60 mL/min (ref >60)
GFR calc non Af Amer: >60 mL/min (ref >60)
Glucose, Bld: 95 mg/dL (ref 65–99)
Potassium: 4.6 mmol/L (ref 3.5–5.1)
Sodium: 136 mmol/L (ref 135–145)
Total Bilirubin: 0.6 mg/dL (ref 0.3–1.2)
Total Protein: 7.7 g/dL (ref 6.5–8.1)

## 2015-10-26 LAB — TSH: TSH: 0.986 u[IU]/mL (ref 0.350–4.500)

## 2015-10-26 LAB — RAPID URINE DRUG SCREEN, HOSP PERFORMED
Amphetamines: NOT DETECTED
BARBITURATES: NOT DETECTED
Benzodiazepines: NOT DETECTED
Cocaine: NOT DETECTED
Opiates: NOT DETECTED
Tetrahydrocannabinol: POSITIVE — AB

## 2015-10-26 LAB — CK: CK TOTAL: 94 U/L (ref 38–234)

## 2015-10-26 MED ORDER — KETOROLAC TROMETHAMINE 30 MG/ML IJ SOLN
30.0000 mg | Freq: Once | INTRAMUSCULAR | Status: AC
  Administered 2015-10-26: 30 mg via INTRAVENOUS
  Filled 2015-10-26: qty 1

## 2015-10-26 MED ORDER — NICOTINE 14 MG/24HR TD PT24
14.0000 mg | MEDICATED_PATCH | Freq: Every day | TRANSDERMAL | Status: DC
  Administered 2015-10-26 – 2015-10-27 (×2): 14 mg via TRANSDERMAL
  Filled 2015-10-26 (×2): qty 1

## 2015-10-26 MED ORDER — LEVETIRACETAM 500 MG PO TABS
500.0000 mg | ORAL_TABLET | Freq: Two times a day (BID) | ORAL | Status: DC
  Filled 2015-10-26 (×3): qty 1

## 2015-10-26 MED ORDER — ONDANSETRON HCL 4 MG/2ML IJ SOLN: 4.0000 mg | Freq: Four times a day (QID) | INTRAMUSCULAR | Status: DC | PRN

## 2015-10-26 MED ORDER — INFLUENZA VAC SPLIT QUAD 0.5 ML IM SUSY
0.5000 mL | PREFILLED_SYRINGE | INTRAMUSCULAR | Status: DC
  Filled 2015-10-26: qty 0.5

## 2015-10-26 MED ORDER — SODIUM CHLORIDE 0.9 % IV SOLN
1000.0000 mg | Freq: Once | INTRAVENOUS | Status: AC
  Administered 2015-10-26: 1000 mg via INTRAVENOUS
  Filled 2015-10-26: qty 10

## 2015-10-26 MED ORDER — CYCLOBENZAPRINE HCL 10 MG PO TABS
10.0000 mg | ORAL_TABLET | Freq: Once | ORAL | Status: AC
  Administered 2015-10-26: 10 mg via ORAL
  Filled 2015-10-26: qty 1

## 2015-10-26 MED ORDER — LEVETIRACETAM 500 MG PO TABS
500.0000 mg | ORAL_TABLET | Freq: Two times a day (BID) | ORAL | Status: DC
  Administered 2015-10-26 – 2015-10-27 (×2): 500 mg via ORAL
  Filled 2015-10-26 (×2): qty 1

## 2015-10-26 MED ORDER — LORAZEPAM 2 MG/ML IJ SOLN
1.0000 mg | Freq: Four times a day (QID) | INTRAMUSCULAR | Status: DC | PRN
  Administered 2015-10-26: 1 mg via INTRAVENOUS
  Filled 2015-10-26 (×3): qty 0.5

## 2015-10-26 MED ORDER — LORAZEPAM 2 MG/ML IJ SOLN
1.0000 mg | Freq: Four times a day (QID) | INTRAMUSCULAR | Status: DC | PRN
  Filled 2015-10-26: qty 0.5

## 2015-10-26 MED ORDER — GADOBENATE DIMEGLUMINE 529 MG/ML IV SOLN
17.0000 mL | Freq: Once | INTRAVENOUS | Status: AC | PRN
  Administered 2015-10-26: 17 mL via INTRAVENOUS

## 2015-10-26 MED ORDER — ENOXAPARIN SODIUM 40 MG/0.4ML ~~LOC~~ SOLN
40.0000 mg | SUBCUTANEOUS | Status: DC
Start: 2015-10-26 — End: 2015-10-27
  Administered 2015-10-26: 40 mg via SUBCUTANEOUS
  Filled 2015-10-26: qty 0.4

## 2015-10-26 MED ORDER — PNEUMOCOCCAL VAC POLYVALENT 25 MCG/0.5ML IJ INJ
0.5000 mL | INJECTION | INTRAMUSCULAR | Status: AC
  Administered 2015-10-27: 0.5 mL via INTRAMUSCULAR
  Filled 2015-10-26: qty 0.5

## 2015-10-26 MED ORDER — AMMONIA AROMATIC IN INHA
0.3000 mL | Freq: Once | RESPIRATORY_TRACT | Status: DC
  Filled 2015-10-26: qty 10

## 2015-10-26 MED ORDER — SODIUM CHLORIDE 0.9 % IV SOLN
INTRAVENOUS | Status: DC
  Administered 2015-10-26: 10:00:00 via INTRAVENOUS

## 2015-10-26 MED ORDER — ONDANSETRON HCL 4 MG PO TABS: 4.0000 mg | ORAL_TABLET | Freq: Four times a day (QID) | ORAL | Status: DC | PRN

## 2015-10-26 MED ORDER — SODIUM CHLORIDE 0.9 % IV SOLN: 75.0000 mL/h | INTRAVENOUS | Status: DC
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
## 2015-10-26 NOTE — MAU Note (Signed)
Sign other called out that pt was having a seizure.  Upon arrival to room RN observed pt in a stare with her hands all contoured.  No jerking movements.  Sign other states it was less than 40 seconds in duration.  Pt arouses to RN voice and slightly confused.  Report to Dr. Alvester Morin given.
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
## 2015-10-26 NOTE — Progress Notes (Signed)
EEG completed, results pending. 

## 2015-10-26 NOTE — MAU Note (Signed)
Called to bedside, witnessed sz by sign other.  Pt postictal when RN approached bed.  Sign other states it lasted about 10 seconds.  Pt very tired and hot.  Wet wash rag applied to forehead. Temp adjusted in room. Pt needing to void.  Dabney, CNA placed pt on bedpan and assisted getting undressed into gown.  UDS sent.

## 2015-10-26 NOTE — Progress Notes (Signed)
Pt went to MAU after multiple seizures. 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 Loaded w/ Keppra and sent to [facility] for EEG and neuro eval as pt continues to have short seizures. AFVSS. CMET and CBC nml. Additional labs pending. Telemetry bed ordered. Page neuro on arrival  Shelly Flatten, MD [facility] 10/26/2015, 10:26 AM

## 2015-10-26 NOTE — Progress Notes (Signed)
Patient's seizure condition needs to be reported to [facility] by admitting team prior to discharge.  Federico Flake, MD
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
## 2015-10-27 LAB — PROLACTIN: Prolactin: 15.8 ng/mL (ref 4.8–23.3)

## 2015-10-27 MED ORDER — LEVETIRACETAM 500 MG PO TABS: 500.0000 mg | ORAL_TABLET | Freq: Two times a day (BID) | ORAL | Status: DC

## 2015-10-27 NOTE — Progress Notes (Signed)
Pt discharged home with significant other. IV removed and telemetry discontinued. Discharge instructions given. No questions at this time. Left unit via wheelchair with volunteer services at 1530. Lawson Radar
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
## 2015-10-27 NOTE — Care Management Note (Signed)
Case Management Note  Patient Details  Name: Sabrina Simpson MRN: [id number] Date of Birth: September 16, 1981  Subjective/Objective:                    Action/Plan: Patient admitted with seizures. No insurance and PCP per patient. Plan is to discharge patient home on Keppra. CM spoke with [name] and she was interested in being seen at the [facility]. CM arranged her an appointment on Feb 15th at 3:30 pm and placed this on the AVS. Patient given the address and information booklet on [facility], and the pharmacy, and instructed her to use the pharmacy for her medication prescriptions at a reduced cost to her. Patient voiced understanding. Bedside RN updated.   Expected Discharge Date:                  Expected Discharge Plan:  Home/Self Care  In-House Referral:     Discharge planning Services  CM Consult  Post Acute Care Choice:    Choice offered to:     DME Arranged:    DME Agency:     HH Arranged:    HH Agency:     Status of Service:  In process, will continue to follow  Medicare Important Message Given:    Date Medicare IM Given:    Medicare IM give by:    Date Additional Medicare IM Given:    Additional Medicare Important Message give by:     If discussed at Long Length of Stay Meetings, dates discussed:    Additional Comments:  Kermit Balo, RN 10/27/2015, 11:24 AM

## 2015-10-29 ENCOUNTER — Encounter (HOSPITAL_COMMUNITY): Payer: Self-pay

## 2015-10-29 ENCOUNTER — Emergency Department (HOSPITAL_COMMUNITY)
Admission: EM | Admit: 2015-10-29 | Discharge: 2015-10-29 | Disposition: A | Discharge status: Home / Self Care | Payer: Medicaid Other | Attending: Emergency Medicine | Admitting: Emergency Medicine

## 2015-10-29 DIAGNOSIS — Z87442 Personal history of urinary calculi: Secondary | ICD-10-CM | POA: Insufficient documentation

## 2015-10-29 DIAGNOSIS — Z862 Personal history of diseases of the blood and blood-forming organs and certain disorders involving the immune mechanism: Secondary | ICD-10-CM | POA: Insufficient documentation

## 2015-10-29 DIAGNOSIS — R569 Unspecified convulsions: Secondary | ICD-10-CM

## 2015-10-29 DIAGNOSIS — Z79899 Other long term (current) drug therapy: Secondary | ICD-10-CM | POA: Insufficient documentation

## 2015-10-29 DIAGNOSIS — F17210 Nicotine dependence, cigarettes, uncomplicated: Secondary | ICD-10-CM | POA: Insufficient documentation

## 2015-10-29 DIAGNOSIS — R32 Unspecified urinary incontinence: Secondary | ICD-10-CM | POA: Insufficient documentation

## 2015-10-29 MED ORDER — LEVETIRACETAM 750 MG PO TABS
750.0000 mg | ORAL_TABLET | Freq: Once | ORAL | Status: DC
  Filled 2015-10-29: qty 1

## 2015-10-29 MED ORDER — LEVETIRACETAM 500 MG PO TABS
1000.0000 mg | ORAL_TABLET | Freq: Once | ORAL | Status: AC
  Administered 2015-10-29: 1000 mg via ORAL
  Filled 2015-10-29 (×2): qty 2
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
## 2015-10-29 NOTE — ED Notes (Signed)
Pt stable, ambulatory, states understanding of discharge instructions.  Family at bedside.

## 2015-10-29 NOTE — ED Notes (Signed)
Per Family: Pt discharged from hospital yesterday, was resting at home today and began having seizures. Seized x 3. Pt a/o x3 upon arrival. Recently started taking Keppra PO, missed first day's dose, but has had today's dose.

## 2015-10-29 NOTE — ED Provider Notes (Signed)
10:17 PM Patient care assumed from Audry Pili, PA-C with plan to d/c after completion of care management consultation. Patient has been seen by CM in the ED who has helped coordinate outpatient f/u. Keppra load completed. VSS. Patient stable for d/c.  Antony Madura, PA-C 10/29/15 2218  Doug Sou, MD 10/30/15 0120
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
## 2015-10-29 NOTE — ED Provider Notes (Signed)
Patient reportedly had generalized seizure today. Presently asymptomatic except feels tired. She is alert Glasgow Coma Score 15. No distress. She reportedly missed one dose of Keppra. Spoke with hospital pharmacist will load intravenously with Keppra. Case management consulted this patient states she cannot afford to see a neurologist as outpatient  Doug Sou, MD 10/30/15 0120

## 2015-11-03 ENCOUNTER — Inpatient Hospital Stay: Payer: Medicaid Other | Admitting: Internal Medicine

## 2015-11-09 ENCOUNTER — Ambulatory Visit: Payer: Self-pay | Attending: Family Medicine | Admitting: Family Medicine

## 2015-11-09 ENCOUNTER — Ambulatory Visit (HOSPITAL_BASED_OUTPATIENT_CLINIC_OR_DEPARTMENT_OTHER): Payer: Medicaid Other | Admitting: Clinical

## 2015-11-09 ENCOUNTER — Encounter: Payer: Self-pay | Admitting: Family Medicine

## 2015-11-09 VITALS — BP 108/70 | HR 68 | Temp 98.0°F | Resp 15 | Ht 66.0 in | Wt 203.2 lb

## 2015-11-09 DIAGNOSIS — Z23 Encounter for immunization: Secondary | ICD-10-CM | POA: Insufficient documentation

## 2015-11-09 DIAGNOSIS — K921 Melena: Secondary | ICD-10-CM | POA: Insufficient documentation

## 2015-11-09 DIAGNOSIS — Z131 Encounter for screening for diabetes mellitus: Secondary | ICD-10-CM | POA: Insufficient documentation

## 2015-11-09 DIAGNOSIS — Z79899 Other long term (current) drug therapy: Secondary | ICD-10-CM | POA: Insufficient documentation

## 2015-11-09 DIAGNOSIS — R569 Unspecified convulsions: Secondary | ICD-10-CM | POA: Insufficient documentation

## 2015-11-09 DIAGNOSIS — F1290 Cannabis use, unspecified, uncomplicated: Secondary | ICD-10-CM | POA: Insufficient documentation

## 2015-11-09 DIAGNOSIS — F4323 Adjustment disorder with mixed anxiety and depressed mood: Secondary | ICD-10-CM

## 2015-11-09 DIAGNOSIS — F1910 Other psychoactive substance abuse, uncomplicated: Secondary | ICD-10-CM

## 2015-11-09 DIAGNOSIS — K649 Unspecified hemorrhoids: Secondary | ICD-10-CM | POA: Insufficient documentation

## 2015-11-09 DIAGNOSIS — Z0000 Encounter for general adult medical examination without abnormal findings: Secondary | ICD-10-CM

## 2015-11-09 LAB — POCT GLYCOSYLATED HEMOGLOBIN (HGB A1C): HEMOGLOBIN A1C: 5.2

## 2015-11-09 MED ORDER — HYDROCORTISONE 2.5 % RE CREA: 1.0000 "application " | TOPICAL_CREAM | Freq: Two times a day (BID) | RECTAL | Status: DC

## 2015-11-09 NOTE — Progress Notes (Signed)
Subjective:  Patient ID: Sabrina Simpson, female    DOB: 24-Apr-1981  Age: 35 y.o. MRN: [id number]  CC: Seizures   HPI Sabrina Simpson  is a 35 year old female who comes to establish care and the clinic after recent hospitalization for seizures in which she was commenced on Keppra.  She was hospitalized at [facility] from 10/26/15 through 10/27/15;  Seen by neurology and EEG was unrevealing, MRI was negative for acute intracranial process.  Today she complains that ever since she commenced the Keppra she has had abdominal pain and intermittent blood in her stools which she describes as blood when she wipes her  Anus but denies constipation. She continues to have seizures about every 4 days she said and she has a female friend with her who is usually the eye witness and seizures are usually tonic-clonic  she does not have an appointment to see neurology.   Outpatient Prescriptions Prior to Visit  Medication Sig Dispense Refill  . levETIRAcetam (KEPPRA) 500 MG tablet Take 1 tablet (500 mg total) by mouth 2 (two) times daily. 60 tablet 2   No facility-administered medications prior to visit.    ROS Review of Systems  Constitutional: Negative for activity change, appetite change and fatigue.  HENT: Negative for congestion, sinus pressure and sore throat.   Eyes: Negative for visual disturbance.  Respiratory: Negative for cough, chest tightness, shortness of breath and wheezing.   Cardiovascular: Negative for chest pain and palpitations.  Gastrointestinal: Positive for abdominal pain and blood in stool. Negative for constipation and abdominal distention.  Endocrine: Negative for polydipsia.  Genitourinary: Negative for dysuria and frequency.  Musculoskeletal: Negative for back pain and arthralgias.  Skin: Negative for rash.  Neurological: Positive for seizures. Negative for tremors, light-headedness and numbness.  Hematological: Does not bruise/bleed easily.  Psychiatric/Behavioral:  Negative for behavioral problems and agitation.    Objective:  BP 108/70 mmHg  Pulse 68  Temp(Src) 98 F (36.7 C)  Resp 15  Ht  (1.676 m)  Wt 203 lb 3.2 oz (92.171 kg)  BMI 32.81 kg/m2  SpO2 100%  LMP 10/20/2015 (Exact Date)  BP/Weight 11/09/2015 10/29/2015 10/27/2015  Systolic BP 108 112 111  Diastolic BP 70 81 62  Wt. (Lbs) 203.2 - -  BMI 32.81 - -      Physical Exam  Constitutional: She is oriented to person, place, and time. She appears well-developed and well-nourished.  Cardiovascular: Normal rate, normal heart sounds and intact distal pulses.   No murmur heard. Pulmonary/Chest: Effort normal and breath sounds normal. She has no wheezes. She has no rales. She exhibits no tenderness.  Abdominal: Soft. Bowel sounds are normal. She exhibits no distension and no mass. There is no tenderness.  Musculoskeletal: Normal range of motion.  Neurological: She is alert and oriented to person, place, and time.     Assessment & Plan:   1. Diabetes mellitus screening  A1c is 5.2 -normal - HgB A1c  2. Healthcare maintenance - Flu Vaccine QUAD 36+ mos PF IM (Fluarix & Fluzone Quad PF)  3. Seizures (HCC)   Uncontrolled on Keppra.  patient complains of inability to tolerate Keppra due to abdominal upset  referred to neurology  Advised against driving  4. Hematochezia  Will treat presumptively for hemorrhoids and reassess at her next visit since symptoms are intermittent and she thinks it's related to her Keppra. - hydrocortisone (ANUSOL-HC) 2.5 % rectal cream; Place 1 application rectally 2 (two) times daily.  Dispense: 30 g; Refill: 0  5. Substance abuse   Counselled on cessation of marijuana use.   Meds ordered this encounter  Medications  . hydrocortisone (ANUSOL-HC) 2.5 % rectal cream    Sig: Place 1 application rectally 2 (two) times daily.    Dispense:  30 g    Refill:  0    Follow-up: Return in about 3 weeks (around 11/30/2015) for  follow-up on seizures and  hematochezia.   Jaclyn Shaggy MD

## 2015-11-09 NOTE — Progress Notes (Signed)
Last seizure 5 days ago States she feels Keppra is causing her abdominal pain She has noticed bright red blood when she has bowel movement-has history of hemmorhoids while pregnant Would like to speak to [place] regarding disability

## 2015-11-09 NOTE — Progress Notes (Signed)
ASSESSMENT: Pt currently experiencing Adjustment disorder with mixed anxiety and depressed mood Stage of Change: contemplative  PLAN: 1. F/U with behavioral health consultant in one month 2. Psychiatric Medications: none. 3. Behavioral recommendation(s):   -Go to social security office to begin disability application -Consider reading educational material regarding coping with symptoms of anxiety and depression  SUBJECTIVE: Pt. referred by Dr Venetia Night for psychosocial, symptoms of anxiety and depression:  Pt. reports the following symptoms/concerns: Pt states that her primary concern is that her seizures have caused her to lose her job and that she is unable to work; wants to apply for disability. Pt says that the recent job loss and increase in seizures is causing her to feel anxious and depressed, including constant worry and being unable to stay asleep for more than 2 or 3 hours at a time/night.  Duration of problem: Less than one month Severity: moderate  OBJECTIVE: Orientation & Cognition: Oriented x3. Thought processes normal and appropriate to situation. Mood: appropriate. Affect: appropriate Appearance: appropriate Risk of harm to self or others: no known risk of harm to self or others Substance use: tobacco, alcohol Assessments administered: PHQ/GAD(Note: pt took copy with her, exact score unknown)  Diagnosis: Adjustment disorder with mixed anxiety and depressed mood CPT Code: F43.23 -------------------------------------------- Other(s) present in the room: female friend  Time spent with patient in exam room: 20 minutes, 11:00-11:20am

## 2015-12-30 ENCOUNTER — Telehealth: Payer: Self-pay | Admitting: Clinical

## 2015-12-30 NOTE — Telephone Encounter (Signed)
Termination w pt; Ms. Borton [facility] prior to end of April, as needed.

## 2016-01-31 ENCOUNTER — Emergency Department (HOSPITAL_COMMUNITY)
Admission: EM | Admit: 2016-01-31 | Discharge: 2016-01-31 | Disposition: A | Discharge status: Home / Self Care | Payer: Medicaid Other | Attending: Emergency Medicine | Admitting: Emergency Medicine

## 2016-01-31 ENCOUNTER — Encounter (HOSPITAL_COMMUNITY): Payer: Self-pay

## 2016-01-31 DIAGNOSIS — Z87442 Personal history of urinary calculi: Secondary | ICD-10-CM | POA: Insufficient documentation

## 2016-01-31 DIAGNOSIS — Z862 Personal history of diseases of the blood and blood-forming organs and certain disorders involving the immune mechanism: Secondary | ICD-10-CM | POA: Insufficient documentation

## 2016-01-31 DIAGNOSIS — Z79899 Other long term (current) drug therapy: Secondary | ICD-10-CM | POA: Insufficient documentation

## 2016-01-31 DIAGNOSIS — R51 Headache: Secondary | ICD-10-CM | POA: Insufficient documentation

## 2016-01-31 DIAGNOSIS — K0381 Cracked tooth: Secondary | ICD-10-CM | POA: Insufficient documentation

## 2016-01-31 DIAGNOSIS — Z7952 Long term (current) use of systemic steroids: Secondary | ICD-10-CM | POA: Insufficient documentation

## 2016-01-31 DIAGNOSIS — K0889 Other specified disorders of teeth and supporting structures: Secondary | ICD-10-CM

## 2016-01-31 DIAGNOSIS — F17210 Nicotine dependence, cigarettes, uncomplicated: Secondary | ICD-10-CM | POA: Insufficient documentation

## 2016-01-31 MED ORDER — IBUPROFEN 600 MG PO TABS: 600.0000 mg | ORAL_TABLET | Freq: Four times a day (QID) | ORAL | Status: DC | PRN

## 2016-01-31 MED ORDER — AMOXICILLIN 500 MG PO CAPS: 500.0000 mg | ORAL_CAPSULE | Freq: Three times a day (TID) | ORAL | Status: DC

## 2016-01-31 MED ORDER — KETOROLAC TROMETHAMINE 60 MG/2ML IM SOLN
60.0000 mg | Freq: Once | INTRAMUSCULAR | Status: AC
  Administered 2016-01-31: 60 mg via INTRAMUSCULAR
  Filled 2016-01-31: qty 2

## 2016-01-31 MED ORDER — OXYCODONE-ACETAMINOPHEN 5-325 MG PO TABS
1.0000 | ORAL_TABLET | Freq: Once | ORAL | Status: AC
  Administered 2016-01-31: 1 via ORAL
  Filled 2016-01-31: qty 1
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
## 2016-01-31 NOTE — Discharge Instructions (Signed)
Ibuprofen for pain. You can also apply oragel for pain relief. Rinse your mouth with salt water several times a day especially after eating. Follow up with a dentist. Amoxil as prescribed until all gone.   [employer] [facility] [employer] 211 is a great source of information about community services available.  Access by dialing 2-1-1 from anywhere in [place], or by website -  PooledIncome.plwww.nc211.org.   Other Local Resources (Updated 09/2015)  Dental  Care   Services    Phone Number and Address  Cost  [facility] For children 540 - 36 years of age:   Cleaning  Tooth brushing/flossing instruction  Sealants, fillings, crowns  Extractions  Emergency treatment  [phone] [address] [place], [place] [address] Charges based on family income.  Medicaid and some insurance plans accepted.     [facility]  Cleaning  Sealants, fillings, crowns  Extractions  Emergency treatment [phone] [address] , [place]  Pregnant women 36 years of age or older with a Medicaid card  [facility] [place]  Cleaning  Sealants, fillings, crowns  Extractions  Emergency treatment [phone] [address] [place], [place] Pregnant women 818 years of age or older with a Medicaid card  [facility] For children 640 - 36 years of age:   Cleaning  Tooth brushing/flossing instruction  Sealants, fillings, crowns  Extractions  Emergency treatment Limited orthodontic services for patients with Medicaid [phone] [address] [place], [place] [address] Medicaid and [facility] Health Choice cover for children up to age 10221 and pregnant women.  Parents of children up to age 221 without Medicaid pay a reduced fee at time of service.  [facility] [place] For children  460 - 36 years of age:   Cleaning  Tooth brushing/flossing instruction  Sealants, fillings, crowns  Extractions  Emergency treatment Limited orthodontic services for patients with Medicaid [phone] [address] [place], [place].  Medicaid and [place] Health Choice cover for children up to age 36 and pregnant women.  Parents of children up to age 36 without Medicaid pay a reduced fee.  [facility]  Cleaning  Sealants, fillings, crowns  Extractions  Hours: Tuesdays and Thursdays, 4:15 - 8 pm [phone] [address], [address] [place], [place] [address] Services free of charge to [facility] residents ages 18-64 who do not have health insurance, Medicare, [place], or [place] benefits and fall within federal poverty guidelines  [facility]    Provides dental care in addition to primary medical care, nutritional counseling, and pharmacy:  Nurse, mental healthCleaning  Sealants, fillings, crowns  Extractions                  [phone] [facility], [address] [place], [place]  [phone] [facility], [address] [place], [place]  [phone] [facility] [place], [place]  [phone] [facility], [address] [place], [place]  [phone] [facility] [address] [place], [place] Accepts [place], [place]are, most insurance.  Also provides services available to all with fees adjusted based on ability to pay.    [facility]  Cleaning  Tooth brushing/flossing instruction  Sealants, fillings, crowns  Extractions  Emergency treatment Hours: Tuesdays, Thursdays,  and Fridays from 8 am to 5 pm by appointment only. [phone] [address] [place], [place] [address] [facility] residents with Medicaid (depending on eligibility) and children with [facility]  Choice - call for more information.  [facility]  Extractions only  Hours: 2nd and 4th Thursday of each month from 6:30 am - 9 am.   [phone] ext. [phone] [place], [place] [address] Ages 71 and older only.  Patients are seen on a first come, first served basis.  [employer]  [employer]  Extractions  Orthodontics  Endodontics  Implants/Crowns/Bridges  Complete and partial dentures [phone] [place],  Patients must complete an application for services.  There is often a waiting list.

## 2016-01-31 NOTE — ED Notes (Signed)
Pt states a piece of her right upper wisdom tooth broke off.

## 2016-12-20 ENCOUNTER — Encounter (HOSPITAL_COMMUNITY): Payer: Self-pay

## 2016-12-20 ENCOUNTER — Emergency Department (HOSPITAL_COMMUNITY)
Admission: EM | Admit: 2016-12-20 | Discharge: 2016-12-20 | Disposition: A | Discharge status: Home / Self Care | Payer: Medicaid Other | Attending: Emergency Medicine | Admitting: Emergency Medicine

## 2016-12-20 DIAGNOSIS — Z79899 Other long term (current) drug therapy: Secondary | ICD-10-CM | POA: Insufficient documentation

## 2016-12-20 DIAGNOSIS — G40909 Epilepsy, unspecified, not intractable, without status epilepticus: Secondary | ICD-10-CM | POA: Insufficient documentation

## 2016-12-20 DIAGNOSIS — F17210 Nicotine dependence, cigarettes, uncomplicated: Secondary | ICD-10-CM | POA: Diagnosis not present

## 2016-12-20 DIAGNOSIS — R569 Unspecified convulsions: Secondary | ICD-10-CM

## 2016-12-20 LAB — URINALYSIS, ROUTINE W REFLEX MICROSCOPIC
BILIRUBIN URINE: NEGATIVE
GLUCOSE, UA: NEGATIVE mg/dL
Hgb urine dipstick: NEGATIVE
Ketones, ur: NEGATIVE mg/dL
Leukocytes, UA: NEGATIVE
NITRITE: NEGATIVE
PH: 7 (ref 5.0–8.0)
Protein, ur: NEGATIVE mg/dL
SPECIFIC GRAVITY, URINE: 1.014 (ref 1.005–1.030)

## 2016-12-20 LAB — CBC WITH DIFFERENTIAL/PLATELET
BASOS ABS: 0 10*3/uL (ref 0.0–0.1)
Basophils Relative: 0 %
Eosinophils Absolute: 0.1 10*3/uL (ref 0.0–0.7)
Eosinophils Relative: 1 %
HCT: 39.4 % (ref 36.0–46.0)
Hemoglobin: 13.3 g/dL (ref 12.0–15.0)
LYMPHS ABS: 1.7 10*3/uL (ref 0.7–4.0)
Lymphocytes Relative: 24 %
MCH: 30.8 pg (ref 26.0–34.0)
MCHC: 33.8 g/dL (ref 30.0–36.0)
MCV: 91.2 fL (ref 78.0–100.0)
Monocytes Absolute: 0.4 10*3/uL (ref 0.1–1.0)
Monocytes Relative: 5 %
NEUTROS ABS: 4.8 10*3/uL (ref 1.7–7.7)
Neutrophils Relative %: 70 %
Platelets: 241 10*3/uL (ref 150–400)
RBC: 4.32 MIL/uL (ref 3.87–5.11)
RDW: 14.6 % (ref 11.5–15.5)
WBC: 7 10*3/uL (ref 4.0–10.5)

## 2016-12-20 LAB — BASIC METABOLIC PANEL
ANION GAP: 8 (ref 5–15)
BUN: <5 mg/dL — ABNORMAL LOW (ref 6–20)
CO2: 21 mmol/L — ABNORMAL LOW (ref 22–32)
Calcium: 8.4 mg/dL — ABNORMAL LOW (ref 8.9–10.3)
Chloride: 109 mmol/L (ref 101–111)
Creatinine, Ser: 0.79 mg/dL (ref 0.44–1.00)
GFR calc Af Amer: >60 mL/min (ref >60)
Glucose, Bld: 106 mg/dL — ABNORMAL HIGH (ref 65–99)
POTASSIUM: 3.5 mmol/L (ref 3.5–5.1)
SODIUM: 138 mmol/L (ref 135–145)

## 2016-12-20 LAB — POC URINE PREG, ED: Preg Test, Ur: NEGATIVE

## 2016-12-20 MED ORDER — SODIUM CHLORIDE 0.9 % IV SOLN
1000.0000 mg | Freq: Once | INTRAVENOUS | Status: AC
  Administered 2016-12-20: 1000 mg via INTRAVENOUS
  Filled 2016-12-20: qty 20

## 2016-12-20 MED ORDER — PHENYTOIN SODIUM EXTENDED 100 MG PO CAPS: 100.0000 mg | ORAL_CAPSULE | Freq: Every day | ORAL | 1 refills | Status: DC

## 2016-12-20 MED ORDER — SODIUM CHLORIDE 0.9 % IV SOLN
INTRAVENOUS | Status: DC
  Administered 2016-12-20: 1000 mL via INTRAVENOUS

## 2016-12-20 NOTE — ED Notes (Signed)
Walked patient to the bathroom pt did well back in bed

## 2016-12-20 NOTE — ED Notes (Signed)
c/o burning at IV site after Dilantin was started warm compresses applied.

## 2016-12-20 NOTE — Discharge Instructions (Signed)
Take the prescribed medication as directed. Follow-up with neurology-- your dilantin dosing may need to be tapered up.  You can call to make an appt or have your PCP formally refer you. Return to the ED for new or worsening symptoms.

## 2016-12-20 NOTE — ED Notes (Signed)
Walked patient to the bathroom patient did well pt is back in the bed family is at bedside

## 2016-12-20 NOTE — ED Notes (Signed)
Patient presents to ed states she had  2 seizures this am . States  She hasn't taken her Keppra in approx. 2 weeks because it makes her gain weight. States she didn't bite her tongue nor was she incont.
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
## 2016-12-20 NOTE — ED Notes (Signed)
Hooked patient up on the monitor patient is resting waiting on provider

## 2016-12-20 NOTE — ED Triage Notes (Signed)
Patient complains of seizure early am today and then short seizure while sitting at desk at work this am. Denies any trauma following the seizure. States that she has been taking less than prescribed keppra due to weight gain. Alert and oriented, NAD

## 2017-06-19 ENCOUNTER — Ambulatory Visit (HOSPITAL_COMMUNITY): Admission: EM | Admit: 2017-06-19 | Discharge: 2017-06-19 | Discharge status: Absent without leave | Payer: Self-pay

## 2018-11-08 ENCOUNTER — Emergency Department (HOSPITAL_COMMUNITY)
Admission: EM | Admit: 2018-11-08 | Discharge: 2018-11-08 | Disposition: A | Discharge status: Home / Self Care | Payer: Self-pay | Attending: Emergency Medicine | Admitting: Emergency Medicine

## 2018-11-08 ENCOUNTER — Encounter (HOSPITAL_COMMUNITY): Payer: Self-pay | Admitting: Emergency Medicine

## 2018-11-08 ENCOUNTER — Other Ambulatory Visit: Payer: Self-pay

## 2018-11-08 DIAGNOSIS — R569 Unspecified convulsions: Secondary | ICD-10-CM | POA: Insufficient documentation

## 2018-11-08 DIAGNOSIS — Z79899 Other long term (current) drug therapy: Secondary | ICD-10-CM | POA: Insufficient documentation

## 2018-11-08 DIAGNOSIS — F17210 Nicotine dependence, cigarettes, uncomplicated: Secondary | ICD-10-CM | POA: Insufficient documentation

## 2018-11-08 DIAGNOSIS — G40909 Epilepsy, unspecified, not intractable, without status epilepticus: Secondary | ICD-10-CM

## 2018-11-08 HISTORY — DX: Patient's noncompliance with other medical treatment and regimen due to unspecified reason: Z91.199

## 2018-11-08 HISTORY — DX: Cannabis abuse, uncomplicated: F12.10

## 2018-11-08 HISTORY — DX: Patient's noncompliance with other medical treatment and regimen: Z91.19

## 2018-11-08 LAB — URINALYSIS, ROUTINE W REFLEX MICROSCOPIC
BILIRUBIN URINE: NEGATIVE
Glucose, UA: NEGATIVE mg/dL
HGB URINE DIPSTICK: NEGATIVE
Ketones, ur: NEGATIVE mg/dL
Leukocytes,Ua: NEGATIVE
Nitrite: NEGATIVE
PROTEIN: NEGATIVE mg/dL
SPECIFIC GRAVITY, URINE: 1.008 (ref 1.005–1.030)
pH: 6 (ref 5.0–8.0)

## 2018-11-08 LAB — CBC WITH DIFFERENTIAL/PLATELET
ABS IMMATURE GRANULOCYTES: 0.03 10*3/uL (ref 0.00–0.07)
BASOS PCT: 0 %
Basophils Absolute: 0 10*3/uL (ref 0.0–0.1)
Eosinophils Absolute: 0.1 10*3/uL (ref 0.0–0.5)
Eosinophils Relative: 1 %
HEMATOCRIT: 43.1 % (ref 36.0–46.0)
Hemoglobin: 13.2 g/dL (ref 12.0–15.0)
IMMATURE GRANULOCYTES: 0 %
Lymphocytes Relative: 23 %
Lymphs Abs: 2.1 10*3/uL (ref 0.7–4.0)
MCH: 28.5 pg (ref 26.0–34.0)
MCHC: 30.6 g/dL (ref 30.0–36.0)
MCV: 93.1 fL (ref 80.0–100.0)
MONO ABS: 0.8 10*3/uL (ref 0.1–1.0)
Monocytes Relative: 8 %
NEUTROS ABS: 6.2 10*3/uL (ref 1.7–7.7)
Neutrophils Relative %: 68 %
Platelets: 249 10*3/uL (ref 150–400)
RBC: 4.63 MIL/uL (ref 3.87–5.11)
RDW: 14.6 % (ref 11.5–15.5)
WBC: 9.2 10*3/uL (ref 4.0–10.5)
nRBC: 0 % (ref 0.0–0.2)

## 2018-11-08 LAB — COMPREHENSIVE METABOLIC PANEL
ALT: 14 U/L (ref 0–44)
AST: 17 U/L (ref 15–41)
Albumin: 3.8 g/dL (ref 3.5–5.0)
Alkaline Phosphatase: 76 U/L (ref 38–126)
Anion gap: 6 (ref 5–15)
BUN: 7 mg/dL (ref 6–20)
CALCIUM: 8.8 mg/dL — AB (ref 8.9–10.3)
CHLORIDE: 106 mmol/L (ref 98–111)
CO2: 26 mmol/L (ref 22–32)
CREATININE: 0.61 mg/dL (ref 0.44–1.00)
Glucose, Bld: 90 mg/dL (ref 70–99)
Potassium: 4.2 mmol/L (ref 3.5–5.1)
Sodium: 138 mmol/L (ref 135–145)
TOTAL PROTEIN: 7.8 g/dL (ref 6.5–8.1)
Total Bilirubin: 0.4 mg/dL (ref 0.3–1.2)

## 2018-11-08 LAB — LIPASE, BLOOD: LIPASE: 36 U/L (ref 11–51)

## 2018-11-08 LAB — PREGNANCY, URINE: PREG TEST UR: NEGATIVE

## 2018-11-08 LAB — VALPROIC ACID LEVEL: Valproic Acid Lvl: 19 ug/mL — ABNORMAL LOW (ref 50.0–100.0)

## 2018-11-08 MED ORDER — DIVALPROEX SODIUM 500 MG PO DR TAB
500.0000 mg | DELAYED_RELEASE_TABLET | Freq: Once | ORAL | Status: AC
  Administered 2018-11-08: 500 mg via ORAL
  Filled 2018-11-08: qty 1

## 2018-11-08 MED ORDER — ONDANSETRON HCL 4 MG/2ML IJ SOLN
4.0000 mg | INTRAMUSCULAR | Status: DC | PRN
  Administered 2018-11-08: 4 mg via INTRAVENOUS
  Filled 2018-11-08: qty 2
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
## 2018-11-08 NOTE — ED Triage Notes (Signed)
Pt transported to ED from work via [facility]. While at work, pt told her friend she felt like she was going to have a seizure, felt aura, and heat. They lowered her to the floor and she began shaking. EMS said she was AOx4 when they arrived. Pt reports her legs were tingling, that she's compliant with meds, takes Depakote, and she's been under a lot of stress lately.

## 2018-11-08 NOTE — Discharge Instructions (Signed)
Take your usual prescriptions as previously directed.  Call your regular medical doctor and your Neurologist today to schedule a follow up appointment within the next week.  Return to the Emergency Department immediately sooner if worsening.

## 2018-11-08 NOTE — ED Notes (Signed)
Bed: KH57 Expected date:  Expected time:  Means of arrival:  Comments: EMS-sz

## 2022-11-01 ENCOUNTER — Encounter (HOSPITAL_COMMUNITY): Payer: Self-pay

## 2022-11-01 ENCOUNTER — Other Ambulatory Visit: Payer: Self-pay

## 2022-11-01 ENCOUNTER — Emergency Department (HOSPITAL_COMMUNITY)
Admission: EM | Admit: 2022-11-01 | Discharge: 2022-11-01 | Disposition: A | Discharge status: Home / Self Care | Payer: No Typology Code available for payment source | Attending: Emergency Medicine | Admitting: Emergency Medicine

## 2022-11-01 DIAGNOSIS — R300 Dysuria: Secondary | ICD-10-CM | POA: Diagnosis present

## 2022-11-01 DIAGNOSIS — N390 Urinary tract infection, site not specified: Secondary | ICD-10-CM | POA: Diagnosis not present

## 2022-11-01 LAB — COMPREHENSIVE METABOLIC PANEL
ALT: 27 U/L (ref 0–44)
AST: 73 U/L — ABNORMAL HIGH (ref 15–41)
Albumin: 3.2 g/dL — ABNORMAL LOW (ref 3.5–5.0)
Alkaline Phosphatase: 98 U/L (ref 38–126)
Anion gap: 14 (ref 5–15)
BUN: <5 mg/dL — ABNORMAL LOW (ref 6–20)
CO2: 20 mmol/L — ABNORMAL LOW (ref 22–32)
Calcium: 8.9 mg/dL (ref 8.9–10.3)
Chloride: 103 mmol/L (ref 98–111)
Creatinine, Ser: 0.72 mg/dL (ref 0.44–1.00)
GFR, Estimated: >60 mL/min (ref >60)
Glucose, Bld: 89 mg/dL (ref 70–99)
Potassium: 3.1 mmol/L — ABNORMAL LOW (ref 3.5–5.1)
Sodium: 137 mmol/L (ref 135–145)
Total Bilirubin: 0.3 mg/dL (ref 0.3–1.2)
Total Protein: 7.4 g/dL (ref 6.5–8.1)

## 2022-11-01 LAB — URINALYSIS, ROUTINE W REFLEX MICROSCOPIC
Bilirubin Urine: NEGATIVE
Glucose, UA: NEGATIVE mg/dL
Ketones, ur: NEGATIVE mg/dL
Nitrite: NEGATIVE
Protein, ur: 100 mg/dL — AB
Specific Gravity, Urine: 1.002 — ABNORMAL LOW (ref 1.005–1.030)
pH: 6 (ref 5.0–8.0)

## 2022-11-01 LAB — POC URINE PREG, ED: Preg Test, Ur: NEGATIVE

## 2022-11-01 LAB — LIPASE, BLOOD: Lipase: 34 U/L (ref 11–51)

## 2022-11-01 LAB — CBC WITH DIFFERENTIAL/PLATELET
Abs Immature Granulocytes: 0.03 10*3/uL (ref 0.00–0.07)
Basophils Absolute: 0 10*3/uL (ref 0.0–0.1)
Basophils Relative: 0 %
Eosinophils Absolute: 0.1 10*3/uL (ref 0.0–0.5)
Eosinophils Relative: 1 %
HCT: 33.5 % — ABNORMAL LOW (ref 36.0–46.0)
Hemoglobin: 10.5 g/dL — ABNORMAL LOW (ref 12.0–15.0)
Immature Granulocytes: 0 %
Lymphocytes Relative: 20 %
Lymphs Abs: 2.1 10*3/uL (ref 0.7–4.0)
MCH: 24.9 pg — ABNORMAL LOW (ref 26.0–34.0)
MCHC: 31.3 g/dL (ref 30.0–36.0)
MCV: 79.4 fL — ABNORMAL LOW (ref 80.0–100.0)
Monocytes Absolute: 0.8 10*3/uL (ref 0.1–1.0)
Monocytes Relative: 8 %
Neutro Abs: 7.5 10*3/uL (ref 1.7–7.7)
Neutrophils Relative %: 71 %
Platelets: 341 10*3/uL (ref 150–400)
RBC: 4.22 MIL/uL (ref 3.87–5.11)
RDW: 16.9 % — ABNORMAL HIGH (ref 11.5–15.5)
WBC: 10.6 10*3/uL — ABNORMAL HIGH (ref 4.0–10.5)
nRBC: 0 % (ref 0.0–0.2)

## 2022-11-01 LAB — MAGNESIUM: Magnesium: 2.2 mg/dL (ref 1.7–2.4)

## 2022-11-01 MED ORDER — POTASSIUM CHLORIDE CRYS ER 20 MEQ PO TBCR
40.0000 meq | EXTENDED_RELEASE_TABLET | Freq: Once | ORAL | Status: AC
  Administered 2022-11-01: 40 meq via ORAL
  Filled 2022-11-01: qty 2

## 2022-11-01 MED ORDER — IBUPROFEN 600 MG PO TABS: 600.0000 mg | ORAL_TABLET | Freq: Four times a day (QID) | ORAL | 0 refills | Status: AC | PRN

## 2022-11-01 MED ORDER — IBUPROFEN 600 MG PO TABS: 600.0000 mg | ORAL_TABLET | Freq: Four times a day (QID) | ORAL | 0 refills | Status: DC | PRN

## 2022-11-01 MED ORDER — SODIUM CHLORIDE 0.9 % IV BOLUS
1000.0000 mL | Freq: Once | INTRAVENOUS | Status: AC
  Administered 2022-11-01: 1000 mL via INTRAVENOUS

## 2022-11-01 MED ORDER — CEPHALEXIN 500 MG PO CAPS: 500.0000 mg | ORAL_CAPSULE | Freq: Four times a day (QID) | ORAL | 0 refills | Status: DC

## 2022-11-01 MED ORDER — SODIUM CHLORIDE 0.9 % IV SOLN
1.0000 g | Freq: Once | INTRAVENOUS | Status: AC
  Administered 2022-11-01: 1 g via INTRAVENOUS
  Filled 2022-11-01: qty 10

## 2022-11-01 MED ORDER — MORPHINE SULFATE (PF) 4 MG/ML IV SOLN
4.0000 mg | Freq: Once | INTRAVENOUS | Status: AC
  Administered 2022-11-01: 4 mg via INTRAVENOUS
  Filled 2022-11-01: qty 1

## 2022-11-01 MED ORDER — CEPHALEXIN 500 MG PO CAPS: 500.0000 mg | ORAL_CAPSULE | Freq: Four times a day (QID) | ORAL | 0 refills | Status: AC

## 2022-11-01 NOTE — Discharge Instructions (Signed)
Please take antibiotic as prescribed as treatment for your urinary tract infection.  Take ibuprofen as needed for pain.  Return to the ER if your symptoms worsen or if you have other concerns.

## 2022-11-01 NOTE — ED Provider Notes (Signed)
[facility] Provider Note   CSN: [id number] Arrival date & time: 11/01/22  [id number]     History {Add pertinent medical, surgical, social history, OB history to HPI:1} Chief Complaint  Patient presents with   Dysuria    Sabrina Simpson is a 42 y.o. female.  The history is provided by the patient and medical records. No language interpreter was used.  Dysuria    42 year old female presenting with dysuria.  Patient reports she just recently came off her menstruation today.  She endorsed acute onset of burning sensation when she urinates follows with lower abdominal discomfort.  Pain is sharp stabbing moderate in severity worse with urination with increased urinary frequency and urgency.  She does not endorse any fever or chills no nausea vomiting or diarrhea no vaginal discharge no back pain no chest pain no shortness of breath.  She tries Tylenol without relief.  Home Medications Prior to Admission medications   Medication Sig Start Date End Date Taking? Authorizing Provider  amoxicillin (AMOXIL) 500 MG capsule Take 1 capsule (500 mg total) by mouth 3 (three) times daily. Patient not taking: Reported on 12/20/2016 01/31/16   Jeannett Senior, PA-C  divalproex (DEPAKOTE) 500 MG DR tablet Take 500 mg by mouth daily.  07/31/18   [provider]  hydrocortisone (ANUSOL-HC) 2.5 % rectal cream Place 1 application rectally 2 (two) times daily. Patient not taking: Reported on 12/20/2016 11/09/15   Charlott Rakes, MD  ibuprofen (ADVIL,MOTRIN) 600 MG tablet Take 1 tablet (600 mg total) by mouth every 6 (six) hours as needed. Patient not taking: Reported on 12/20/2016 01/31/16   Jeannett Senior, PA-C  levETIRAcetam (KEPPRA) 500 MG tablet Take 1 tablet (500 mg total) by mouth 2 (two) times daily. Patient not taking: Reported on 11/08/2018 10/27/15   Annita Brod, MD  phenytoin (DILANTIN) 100 MG ER capsule Take 1 capsule (100 mg total) by mouth  at bedtime. Patient not taking: Reported on 11/08/2018 12/20/16   Larene Pickett, PA-C      Allergies    Patient has no known allergies.    Review of Systems   Review of Systems  Genitourinary:  Positive for dysuria.  All other systems reviewed and are negative.   Physical Exam Updated Vital Signs BP (!) 142/107 (BP Location: Right Arm)   Pulse (!) 112   Temp 98.3 F (36.8 C) (Oral)   Resp 16   SpO2 98%  Physical Exam Vitals and nursing note reviewed.  Constitutional:      General: She is not in acute distress.    Appearance: She is well-developed.  HENT:     Head: Atraumatic.  Eyes:     Conjunctiva/sclera: Conjunctivae normal.  Pulmonary:     Effort: Pulmonary effort is normal.  Abdominal:     Palpations: Abdomen is soft.     Tenderness: There is abdominal tenderness (On tenderness to suprapubic region without guarding or rebound tenderness.). There is no right CVA tenderness or left CVA tenderness.  Musculoskeletal:     Cervical back: Neck supple.  Skin:    Findings: No rash.  Neurological:     Mental Status: She is alert.  Psychiatric:        Mood and Affect: Mood normal.     ED Results / Procedures / Treatments   Labs (all labs ordered are listed, but only abnormal results are displayed) Labs Reviewed  URINALYSIS, ROUTINE W REFLEX MICROSCOPIC  POC URINE PREG, ED  EKG None  Radiology No results found.  Procedures Procedures  {Document cardiac monitor, telemetry assessment procedure when appropriate:1}  Medications Ordered in ED Medications - No data to display  ED Course/ Medical Decision Making/ A&P   {   Click here for ABCD2, HEART and other calculatorsREFRESH Note before signing :1}                          Medical Decision Making Amount and/or Complexity of Data Reviewed Labs: ordered.  Risk Prescription drug management.   BP (!) 142/107 (BP Location: Right Arm)   Pulse (!) 112   Temp 98.3 F (36.8 C) (Oral)   Resp 16   Ht 5'  7" (1.702 m)   Wt 106.6 kg   SpO2 98%   BMI 36.81 kg/m   80:23 AM  42 year old female presenting with dysuria.  Patient reports she just recently came off her menstruation today.  She endorsed acute onset of burning sensation when she urinates follows with lower abdominal discomfort.  Pain is sharp stabbing moderate in severity worse with urination with increased urinary frequency and urgency.  She does not endorse any fever or chills no nausea vomiting or diarrhea no vaginal discharge no back pain no chest pain no shortness of breath.  She tries Tylenol without relief.  On exam, patient is laying in bed appears mildly uncomfortable.  Heart with normal rate and rhythm, lungs are clear to auscultation bilaterally and abdomen is soft but tenderness noted to suprapubic region but without guarding or rebound tenderness.  No CVA tenderness.  -Labs ordered, independently viewed and interpreted by me.  Labs remarkable for WBC 10.6, UA showing evidence of UTI.  Since pt is tachycardic and pain is moderate I have ordered IVF along with Rocephin as treatment.  K+ 3.1, supplementation given. Mag ordered. -The patient was maintained on a cardiac monitor.  I personally viewed and interpreted the cardiac monitored which showed an underlying rhythm of: sinus tachycardia -This patient presents to the ED for concern of dysuria, this involves an extensive number of treatment options, and is a complaint that carries with it a high risk of complications and morbidity.  The differential diagnosis includes UTI, pyelonephritis, kidney stone, STI, cervicitis, appendicitis, colitis -Co morbidities that complicate the patient evaluation includes kidney stone, drug abuse -Treatment includes morphine, rocephin, IVF -Reevaluation of the patient after these medicines showed that the patient improved -PCP office notes or outside notes reviewed -Escalation to admission/observation considered: patients feels much better, is  comfortable with discharge, and will follow up with PCP -Prescription medication considered, patient comfortable with *** -Social Determinant of Health considered which includes ***   {Document critical care time when appropriate:1} {Document review of labs and clinical decision tools ie heart score, Chads2Vasc2 etc:1}  {Document your independent review of radiology images, and any outside records:1} {Document your discussion with family members, caretakers, and with consultants:1} {Document social determinants of health affecting pt's care:1} {Document your decision making why or why not admission, treatments were needed:1} Final Clinical Impression(s) / ED Diagnoses Final diagnoses:  None    Rx / DC Orders ED Discharge Orders     None

## 2022-11-01 NOTE — ED Triage Notes (Signed)
Patient with complaint of dysuria that started this morning, states since urinating this morning the pain has continued. Patient is alert and oriented at this time.

## 2022-11-01 NOTE — ED Notes (Signed)
POC Urine Pregnancy reads Negative. ED-Lab.

## 2023-11-15 ENCOUNTER — Other Ambulatory Visit: Payer: Self-pay

## 2023-11-15 ENCOUNTER — Encounter: Payer: Self-pay | Admitting: *Deleted

## 2023-11-15 ENCOUNTER — Ambulatory Visit
Admission: EM | Admit: 2023-11-15 | Discharge: 2023-11-15 | Disposition: A | Discharge status: Home / Self Care | Payer: Medicaid Other

## 2023-11-15 DIAGNOSIS — J101 Influenza due to other identified influenza virus with other respiratory manifestations: Secondary | ICD-10-CM | POA: Diagnosis not present

## 2023-11-15 LAB — POC COVID19/FLU A&B COMBO
Covid Antigen, POC: NEGATIVE
Influenza A Antigen, POC: POSITIVE — AB
Influenza B Antigen, POC: NEGATIVE

## 2023-11-15 MED ORDER — OSELTAMIVIR PHOSPHATE 75 MG PO CAPS: 75.0000 mg | ORAL_CAPSULE | Freq: Two times a day (BID) | ORAL | 0 refills | Status: AC

## 2023-11-15 MED ORDER — LOPERAMIDE HCL 2 MG PO CAPS: 2.0000 mg | ORAL_CAPSULE | Freq: Four times a day (QID) | ORAL | 0 refills | Status: AC | PRN

## 2023-11-15 MED ORDER — PROMETHAZINE HCL 25 MG PO TABS: 25.0000 mg | ORAL_TABLET | Freq: Four times a day (QID) | ORAL | 0 refills | Status: AC | PRN
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
## 2023-11-15 NOTE — Discharge Instructions (Addendum)
 Influenza test is positive.  You are considered contagious to others as long as you have a measurable fever with a temperature 100 F.  You should consider yourself infectious until you are fever free for 24 hours without fever lowering medications. Continue to alternate Tylenol and ibuprofen for management of fever.   Force fluids to maintain hydration. Tamiflu twice daily for the next 5 days to reduce symptoms and course of influenza virus.   If you develop any shortness of breath, wheezing or difficulty breathing go immediately to the nearest emergency department.

## 2023-11-15 NOTE — ED Triage Notes (Addendum)
 Pt reports aches, pain, emesis since Tuesday. Vomited x 2 today. No meds today. Watery diarrhea also. Left ear pain x 1 week also

## 2023-11-27 ENCOUNTER — Emergency Department (HOSPITAL_COMMUNITY)
Admission: EM | Admit: 2023-11-27 | Discharge: 2023-11-27 | Discharge status: Against Medical Advice | Attending: Emergency Medicine | Admitting: Emergency Medicine

## 2023-11-27 ENCOUNTER — Encounter (HOSPITAL_COMMUNITY): Payer: Self-pay

## 2023-11-27 ENCOUNTER — Other Ambulatory Visit: Payer: Self-pay

## 2023-11-27 DIAGNOSIS — Z5321 Procedure and treatment not carried out due to patient leaving prior to being seen by health care provider: Secondary | ICD-10-CM | POA: Insufficient documentation

## 2023-11-27 DIAGNOSIS — R0602 Shortness of breath: Secondary | ICD-10-CM | POA: Insufficient documentation

## 2023-11-27 DIAGNOSIS — R112 Nausea with vomiting, unspecified: Secondary | ICD-10-CM | POA: Diagnosis not present

## 2023-11-27 DIAGNOSIS — R197 Diarrhea, unspecified: Secondary | ICD-10-CM | POA: Diagnosis not present

## 2023-11-27 LAB — CBC WITH DIFFERENTIAL/PLATELET
Abs Immature Granulocytes: 0.07 10*3/uL (ref 0.00–0.07)
Basophils Absolute: 0 10*3/uL (ref 0.0–0.1)
Basophils Relative: 0 %
Eosinophils Absolute: 0 10*3/uL (ref 0.0–0.5)
Eosinophils Relative: 0 %
HCT: 41.1 % (ref 36.0–46.0)
Hemoglobin: 13.8 g/dL (ref 12.0–15.0)
Immature Granulocytes: 1 %
Lymphocytes Relative: 4 %
Lymphs Abs: 0.5 10*3/uL — ABNORMAL LOW (ref 0.7–4.0)
MCH: 30.3 pg (ref 26.0–34.0)
MCHC: 33.6 g/dL (ref 30.0–36.0)
MCV: 90.1 fL (ref 80.0–100.0)
Monocytes Absolute: 0.4 10*3/uL (ref 0.1–1.0)
Monocytes Relative: 4 %
Neutro Abs: 9.6 10*3/uL — ABNORMAL HIGH (ref 1.7–7.7)
Neutrophils Relative %: 91 %
Platelets: 335 10*3/uL (ref 150–400)
RBC: 4.56 MIL/uL (ref 3.87–5.11)
RDW: 15.9 % — ABNORMAL HIGH (ref 11.5–15.5)
WBC: 10.5 10*3/uL (ref 4.0–10.5)
nRBC: 0 % (ref 0.0–0.2)

## 2023-11-27 LAB — COMPREHENSIVE METABOLIC PANEL
ALT: 37 U/L (ref 0–44)
AST: 75 U/L — ABNORMAL HIGH (ref 15–41)
Albumin: 3.4 g/dL — ABNORMAL LOW (ref 3.5–5.0)
Alkaline Phosphatase: 83 U/L (ref 38–126)
Anion gap: 16 — ABNORMAL HIGH (ref 5–15)
BUN: 5 mg/dL — ABNORMAL LOW (ref 6–20)
CO2: 16 mmol/L — ABNORMAL LOW (ref 22–32)
Calcium: 8.8 mg/dL — ABNORMAL LOW (ref 8.9–10.3)
Chloride: 102 mmol/L (ref 98–111)
Creatinine, Ser: 0.86 mg/dL (ref 0.44–1.00)
GFR, Estimated: >60 mL/min (ref >60)
Glucose, Bld: 125 mg/dL — ABNORMAL HIGH (ref 70–99)
Potassium: 3.1 mmol/L — ABNORMAL LOW (ref 3.5–5.1)
Sodium: 134 mmol/L — ABNORMAL LOW (ref 135–145)
Total Bilirubin: 1.2 mg/dL (ref 0.0–1.2)
Total Protein: 7.3 g/dL (ref 6.5–8.1)

## 2023-11-27 LAB — MAGNESIUM: Magnesium: 1.5 mg/dL — ABNORMAL LOW (ref 1.7–2.4)

## 2023-11-27 LAB — RESP PANEL BY RT-PCR (RSV, FLU A&B, COVID)  RVPGX2
Influenza A by PCR: NEGATIVE
Influenza B by PCR: NEGATIVE
Resp Syncytial Virus by PCR: NEGATIVE
SARS Coronavirus 2 by RT PCR: NEGATIVE

## 2023-11-27 NOTE — ED Notes (Signed)
Pt decided to leave AMA.  

## 2023-11-27 NOTE — ED Triage Notes (Addendum)
 Pt came in via POV d/t this morning when she got out of bed she began having some n/v/d & when she drank some water & Gatorade she laid back down & the reports her whole body locked up. Pt said when she called 911 she decided not to go to the hospital because she was told it could just be anxiety. Upon arrival to ED she hands are still "locked up" with some minimal movement returned. A/Ox4, rates her pain 8/10 during triage.

## 2023-11-27 NOTE — ED Provider Triage Note (Cosign Needed Addendum)
 Emergency Medicine Provider Triage Evaluation Note  Krupa Stege , a 43 y.o. female  was evaluated in triage.  Pt complains of SOB, vomiting, diarrhea.  Review of Systems  Positive:  Negative:   Physical Exam  BP (!) 155/101   Pulse (!) 118   Resp (!) 24   LMP 11/02/2023   SpO2 100%  Gen:   Awake, no distress   Resp:  Normal effort  MSK:   Moves extremities without difficulty  Other:    Medical Decision Making  Medically screening exam initiated at 1:12 PM.  Appropriate orders placed.  Demetri Kerman was informed that the remainder of the evaluation will be completed by another provider, this initial triage assessment does not replace that evaluation, and the importance of remaining in the ED until their evaluation is complete.  Had Flu 2 weeks ago. Vomiting and diarrhea starting last night. Patient stating that she was laying in bed and everything started cramping this morning. Stating that her whole body feels tight and swollen. SOB as well - patient initially tachypneic which resolved as she was answering my questions.   Denies cough, fever, chest pain.       Dorthy Cooler, [place] 11/27/23 1317
